# Patient Record
Sex: Female | Born: 1950 | Race: White | Hispanic: No | Marital: Married | Smoking: Never smoker
Health system: Southern US, Community
[De-identification: ages and names within clinical notes are randomized; demographics above are authoritative.]

## PROBLEM LIST (undated history)

## (undated) DIAGNOSIS — M51369 Other intervertebral disc degeneration, lumbar region without mention of lumbar back pain or lower extremity pain: Secondary | ICD-10-CM

## (undated) DIAGNOSIS — M169 Osteoarthritis of hip, unspecified: Secondary | ICD-10-CM

## (undated) DIAGNOSIS — M5030 Other cervical disc degeneration, unspecified cervical region: Secondary | ICD-10-CM

## (undated) DIAGNOSIS — F32A Depression, unspecified: Secondary | ICD-10-CM

## (undated) DIAGNOSIS — F329 Major depressive disorder, single episode, unspecified: Secondary | ICD-10-CM

## (undated) DIAGNOSIS — M797 Fibromyalgia: Secondary | ICD-10-CM

## (undated) DIAGNOSIS — M179 Osteoarthritis of knee, unspecified: Secondary | ICD-10-CM

## (undated) DIAGNOSIS — M069 Rheumatoid arthritis, unspecified: Secondary | ICD-10-CM

## (undated) DIAGNOSIS — M1710 Unilateral primary osteoarthritis, unspecified knee: Secondary | ICD-10-CM

## (undated) DIAGNOSIS — N189 Chronic kidney disease, unspecified: Secondary | ICD-10-CM

## (undated) DIAGNOSIS — M722 Plantar fascial fibromatosis: Secondary | ICD-10-CM

## (undated) DIAGNOSIS — M5136 Other intervertebral disc degeneration, lumbar region: Secondary | ICD-10-CM

## (undated) HISTORY — PX: REPLACEMENT TOTAL KNEE: SUR1224

## (undated) HISTORY — PX: CHOLECYSTECTOMY: SHX55

## (undated) HISTORY — DX: Osteoarthritis of knee, unspecified: M17.9

## (undated) HISTORY — DX: Major depressive disorder, single episode, unspecified: F32.9

## (undated) HISTORY — DX: Other intervertebral disc degeneration, lumbar region without mention of lumbar back pain or lower extremity pain: M51.369

## (undated) HISTORY — PX: APPENDECTOMY: SHX54

## (undated) HISTORY — DX: Unilateral primary osteoarthritis, unspecified knee: M17.10

## (undated) HISTORY — DX: Fibromyalgia: M79.7

## (undated) HISTORY — DX: Rheumatoid arthritis, unspecified: M06.9

## (undated) HISTORY — PX: KNEE ARTHROPLASTY: SHX992

## (undated) HISTORY — DX: Chronic kidney disease, unspecified: N18.9

## (undated) HISTORY — PX: ANKLE FRACTURE SURGERY: SHX122

## (undated) HISTORY — DX: Plantar fascial fibromatosis: M72.2

## (undated) HISTORY — DX: Osteoarthritis of hip, unspecified: M16.9

## (undated) HISTORY — PX: JOINT REPLACEMENT: SHX530

## (undated) HISTORY — PX: NECK SURGERY: SHX720

## (undated) HISTORY — PX: ELBOW SURGERY: SHX618

## (undated) HISTORY — DX: Depression, unspecified: F32.A

## (undated) HISTORY — PX: SPINE SURGERY: SHX786

## (undated) HISTORY — DX: Other intervertebral disc degeneration, lumbar region: M51.36

## (undated) HISTORY — PX: TOTAL HIP ARTHROPLASTY: SHX124

## (undated) HISTORY — PX: WRIST SURGERY: SHX841

## (undated) HISTORY — DX: Other cervical disc degeneration, unspecified cervical region: M50.30

---

## 2001-05-20 ENCOUNTER — Inpatient Hospital Stay (HOSPITAL_COMMUNITY): Admission: RE | Admit: 2001-05-20 | Discharge: 2001-05-21 | Payer: Self-pay | Admitting: Neurosurgery

## 2001-05-29 ENCOUNTER — Encounter: Admission: RE | Admit: 2001-05-29 | Discharge: 2001-05-29 | Payer: Self-pay | Admitting: Neurosurgery

## 2002-01-13 ENCOUNTER — Encounter: Payer: Self-pay | Admitting: Rheumatology

## 2002-01-13 ENCOUNTER — Encounter: Admission: RE | Admit: 2002-01-13 | Discharge: 2002-01-13 | Payer: Self-pay | Admitting: Rheumatology

## 2004-01-15 ENCOUNTER — Encounter: Admission: RE | Admit: 2004-01-15 | Discharge: 2004-01-15 | Payer: Self-pay | Admitting: Orthopaedic Surgery

## 2004-10-11 ENCOUNTER — Inpatient Hospital Stay (HOSPITAL_COMMUNITY): Admission: RE | Admit: 2004-10-11 | Discharge: 2004-10-14 | Payer: Self-pay | Admitting: Orthopaedic Surgery

## 2004-11-06 ENCOUNTER — Encounter: Admission: RE | Admit: 2004-11-06 | Discharge: 2004-11-06 | Payer: Self-pay | Admitting: Rheumatology

## 2006-08-29 ENCOUNTER — Ambulatory Visit (HOSPITAL_COMMUNITY): Admission: RE | Admit: 2006-08-29 | Discharge: 2006-08-29 | Payer: Self-pay | Admitting: Gastroenterology

## 2007-02-08 ENCOUNTER — Encounter: Admission: RE | Admit: 2007-02-08 | Discharge: 2007-02-08 | Payer: Self-pay | Admitting: Orthopaedic Surgery

## 2007-02-22 ENCOUNTER — Encounter: Admission: RE | Admit: 2007-02-22 | Discharge: 2007-02-22 | Payer: Self-pay | Admitting: Orthopaedic Surgery

## 2007-03-27 ENCOUNTER — Encounter: Admission: RE | Admit: 2007-03-27 | Discharge: 2007-03-27 | Payer: Self-pay | Admitting: Internal Medicine

## 2007-07-11 ENCOUNTER — Encounter: Admission: RE | Admit: 2007-07-11 | Discharge: 2007-07-11 | Payer: Self-pay | Admitting: Internal Medicine

## 2007-08-30 ENCOUNTER — Other Ambulatory Visit: Payer: Self-pay | Admitting: Orthopaedic Surgery

## 2007-09-03 ENCOUNTER — Emergency Department (HOSPITAL_COMMUNITY): Admission: EM | Admit: 2007-09-03 | Discharge: 2007-09-03 | Payer: Self-pay | Admitting: Emergency Medicine

## 2007-10-24 ENCOUNTER — Inpatient Hospital Stay (HOSPITAL_COMMUNITY): Admission: RE | Admit: 2007-10-24 | Discharge: 2007-10-27 | Payer: Self-pay | Admitting: Orthopaedic Surgery

## 2007-10-26 ENCOUNTER — Encounter (INDEPENDENT_AMBULATORY_CARE_PROVIDER_SITE_OTHER): Payer: Self-pay | Admitting: Orthopaedic Surgery

## 2008-04-10 ENCOUNTER — Encounter: Admission: RE | Admit: 2008-04-10 | Discharge: 2008-04-10 | Payer: Self-pay | Admitting: Orthopedic Surgery

## 2008-04-13 ENCOUNTER — Encounter: Admission: RE | Admit: 2008-04-13 | Discharge: 2008-04-13 | Payer: Self-pay | Admitting: Orthopedic Surgery

## 2008-04-24 ENCOUNTER — Encounter: Admission: RE | Admit: 2008-04-24 | Discharge: 2008-04-24 | Payer: Self-pay | Admitting: Orthopedic Surgery

## 2008-05-19 ENCOUNTER — Inpatient Hospital Stay (HOSPITAL_COMMUNITY): Admission: RE | Admit: 2008-05-19 | Discharge: 2008-05-22 | Payer: Self-pay | Admitting: Orthopaedic Surgery

## 2008-05-22 ENCOUNTER — Ambulatory Visit: Admission: RE | Admit: 2008-05-22 | Discharge: 2008-05-29 | Payer: Self-pay | Admitting: Radiation Oncology

## 2009-01-21 IMAGING — CR DG PORTABLE PELVIS
1 series · 1 of 1 positions shown · non-contrast
Comparison: None available

CLINICAL DATA: Infection

PORTABLE PELVIS

[view not recorded]
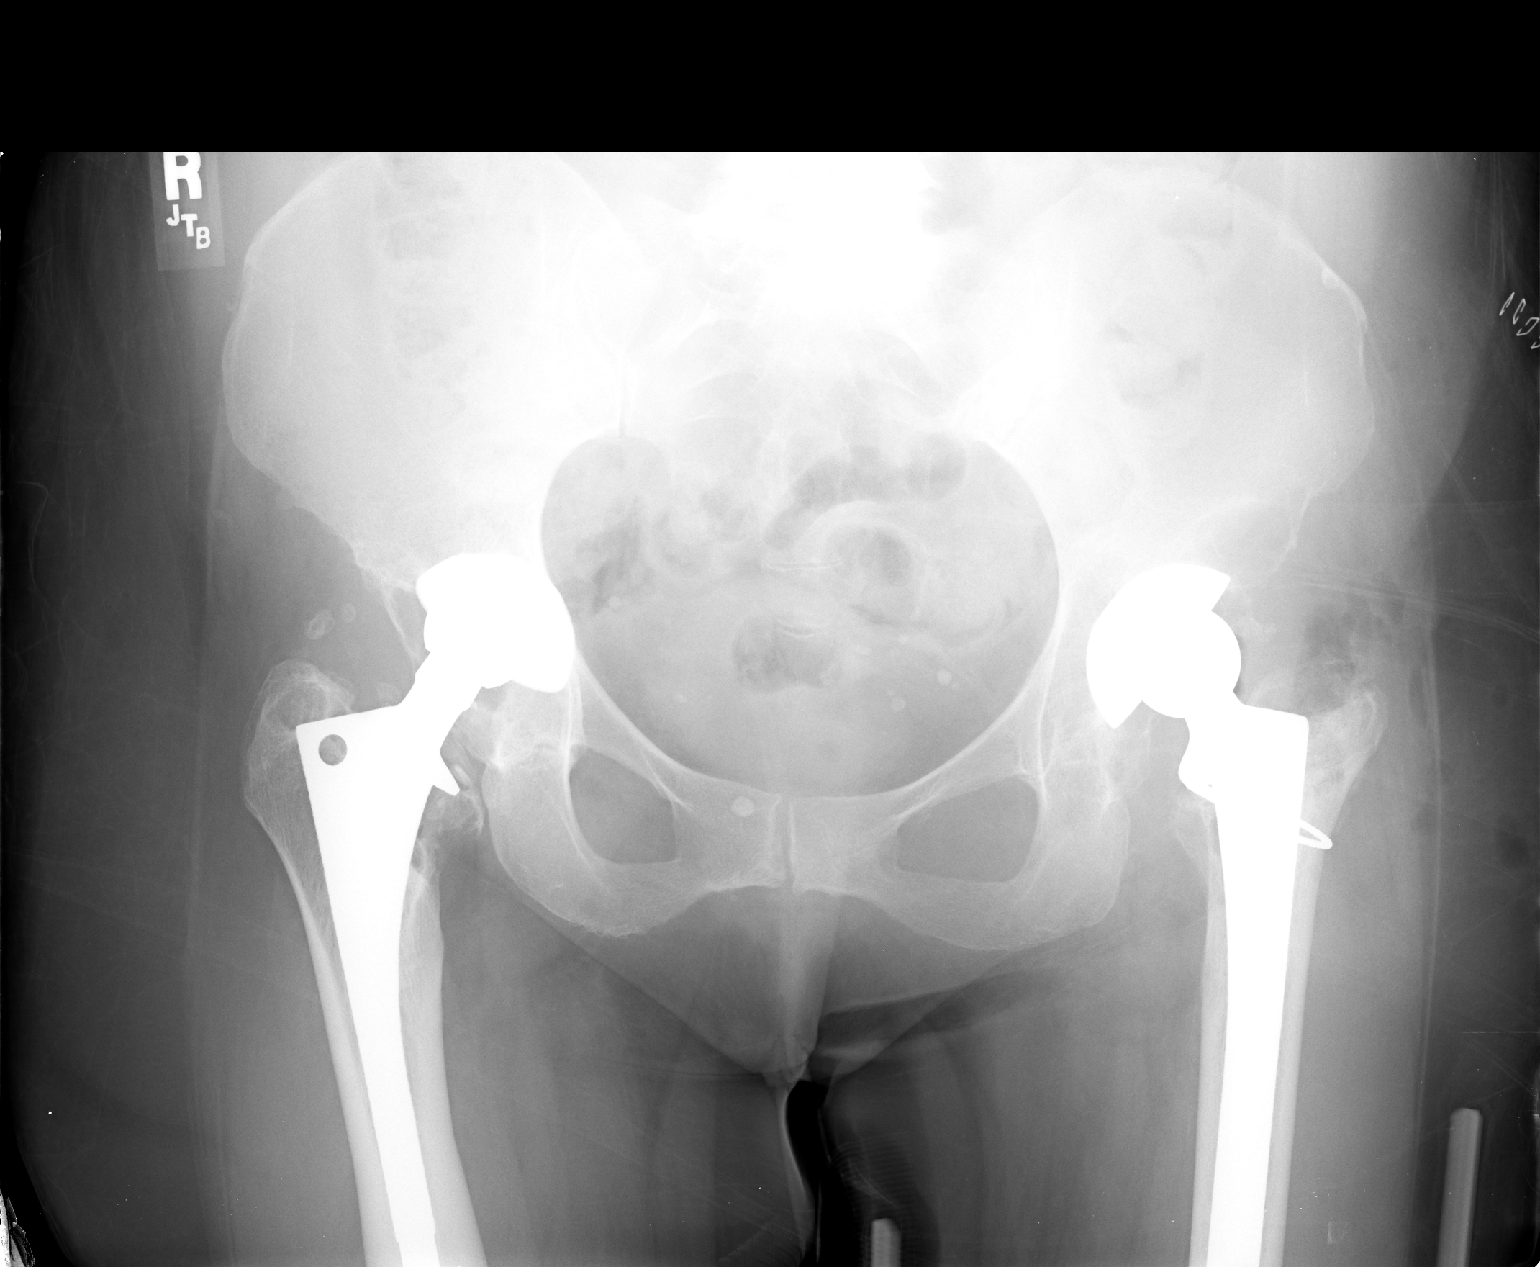

[1 of 1 positions shown; findings below may reference images not displayed]

FINDINGS: Components of bilateral hip arthroplasty.  The acetabular
components show superior migration or placement.  There is mild
protrusio acetabulae on the right.  Cerclage wire is noted about
the proximal left   femur.  Subcutaneous gas bubbles and left
lateral skin staples are noted.  Bilateral pelvic vascular
calcifications.  The distal aspect of the femoral components not
included.
IMPRESSION: 1.  Recent left hip arthroplasty without apparent complication,
distal tip of femoral component not included on this single film.

## 2010-08-02 ENCOUNTER — Encounter: Admission: RE | Admit: 2010-08-02 | Discharge: 2010-08-02 | Payer: Self-pay | Admitting: Orthopaedic Surgery

## 2010-10-16 ENCOUNTER — Encounter: Payer: Self-pay | Admitting: Orthopaedic Surgery

## 2010-10-17 ENCOUNTER — Encounter: Payer: Self-pay | Admitting: Orthopedic Surgery

## 2011-02-07 NOTE — Op Note (Signed)
Tammie [name], [name] NO.:  [id number]   MEDICAL RECORD NO.:  [id number]          PATIENT TYPE:  INP   LOCATION:  5007                         FACILITY:  [facility]   PHYSICIAN:  Claude Manges. Whitfield, M.D.DATE OF BIRTH:  [birth year]   DATE OF PROCEDURE:  10/24/2007  DATE OF DISCHARGE:                               OPERATIVE REPORT   PREOPERATIVE DIAGNOSIS:  End-stage rheumatoid arthritis left knee.   POSTOPERATIVE DIAGNOSIS:  End-stage rheumatoid arthritis left knee.   OPERATION/PROCEDURE:  Left total knee replacement surgery.   SURGEON:  Claude Manges. Cleophas Dunker, M.D.   ASSISTANT:  Arlys John D. Petrarca, P.A.-C.   ANESTHESIA:  General with supplemental femoral nerve block.   COMPLICATIONS:  None.   COMPONENTS:  DePuy LCA standard plus femoral component, #3 rotating  keeled tibial tray with a 10 mm bridging bearing and a metal back  rotating 3-peg patella.  All was secured with polymethyl methacrylate.   PROCEDURE:  The patient was comfortable on the operating table and under  general orotracheal anesthesia with a supplemental femoral nerve block,  the left lower extremity was placed in a thigh tourniquet.  Nursing  staff inserted a Foley catheter.  Urine was clear.   The left lower extremity was then prepped with Betadine scrub and then  DuraPrep from the tourniquet to the ankle.  Sterile draping was  performed.   With the extremity still elevated, was Esmarch exsanguinated with a  proximal tourniquet at 350 mmHg   The midline longitudinal incision was made, centered about the patella,  extending from the superior proximal tibial tubercle via sharp  dissection.  Incision carried down to subcutaneous tissue.  First layer  of capsule was incised in the midline.  A midline parapatellar incision  was made through the deep capsule with the Bovie.  There was a clear  yellow joint effusion.  The patella was then everted 180 degrees and  knee flexed to 90 degrees.  There  were large osteophytes along the  medial lateral femoral condyle, diffuse beefy red rheumatoid synovitis,  and significant loss of articular cartilage with areas of our complete  articular cartilage loss along the lateral and medial femoral condyle,  to a lesser extent the tibial plateau.   Preoperatively we had measured a #3 rotating tibial tray and a standard  plus femoral component.  These were confirmed intraoperatively.   First cut was made transversely in the proximal tibia with the external  tibial jig.  A posterior angle of declination of 7 degrees was utilized.   The initial femoral jig was then applied and then femoral cuts were made  anteriorly and posteriorly.  We checked flexion gap which is a perfectly  symmetrical medial and laterally with a 10 mm gap.   Subsequent cuts were then made on the femur anteriorly and then the  final taper cuts flexion/extension gaps were perfectly symmetrical.  The  LCL and MCL remained intact.  Lamina spreaders were then inserted along  the medial and lateral compartments to remove any remnants of medial and  lateral menisci as well as the ACL and PCL.  Osteophytes removed from  the  posterior aspect of the femoral condyles with a curved osteotome.  There was a Baker cyst identified medially which was debrided.   Retractor was then placed about the tibia, was advanced anteriorly.  We  measured a #3 tibial tray.  This was impacted.  Subsequent cuts were  then made in the center hole and for the keeled component.  With the  trial components in place, the 10 mm bridging bearing was inserted  followed by the trial femur.  Through a full range of motion, we had  full extension.  We had no opening with varus or valgus stress.   Patella was prepared by removing 10 mm of bone leaving 13 mm of patella  thickness.  The patella jig was applied to make the three pilot holes.  The trial tibia was applied and through a full range of motion, it was   perfectly stable.   The trial components removed.  The joint was copiously irrigated with  jet saline.  The final components were then impacted with polymethyl  methacrylate.  We initially applied the tibia.  Extraneous methacrylate  was removed from around its periphery.  A 10 mm rotating tibial  polyethylene component was then applied followed by the cemented femur  and extraneous methacrylate was removed with the Freer.   The knee was then placed in extension.  The patella applied with  methacrylate and patellar clamp.  After complete hardening of the  methacrylate,  the joint was inspected.  Any extraneous methacrylate was  removed with a small curved osteotome.  Prior to making the femoral  tibial cuts, a complete synovectomy was performed of the rheumatoid  synovium.   Marcaine with epinephrine was injected on either side of the capsule.  The tourniquet was deflated about an hour and 10 minutes.  Gross  bleeders were Bovie coagulated.  Hemovac was not necessary.  We again  irrigated the joint.  The deep capsule was closed with interrupted #1  Ethibond.  Superficial capsule closed with a running 0 and then  interrupted 0 and 2-0 Vicryl.  Skin closed with skin clips.  Sterile  bulky dressing was applied followed by the patient's support stocking.   The patient tolerated without complications.      Claude Manges. Cleophas Dunker, M.D.  Electronically Signed     PWW/MEDQ  D:  10/24/2007  T:  10/24/2007  Job:  [id number]

## 2011-02-07 NOTE — Op Note (Signed)
NAMETEMARA, LANUM NO.:  [id number]   MEDICAL RECORD NO.:  [id number]          PATIENT TYPE:  INP   LOCATION:  5008                         FACILITY:  [facility]   PHYSICIAN:  Claude Manges. 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   PROCEDURES:  1. Revision of femoral head and polyethylene component, left total hip      replacement.  2. Extensive synovectomy.  3. Excision of heterotopic ossification.  4. Bone grafting of proximal femur.   SURGEON:  Claude Manges. Cleophas Dunker, MD   ASSISTANT:  Oris Drone. Petrarca, PA-C   ANESTHESIA:  General orotracheal.   COMPLICATIONS:  None.   COMPONENTS:  A locking ring, a Marathon Duraloc acetabular liner 28-mm  inner diameter and 48-mm outer diameter with a 10-degree posterior lip.  A 28-mm outer diameter femoral head with a 1.5-mm neck length.   PROCEDURE:  With the patient comfortable in operating table and under  general orotracheal anesthesia, nursing staff inserted a Foley catheter.  Urine was clear.   The patient was then placed in a lateral decubitus position with the  left side up and secured to the operating room table with the Innomed  hip system.   The left hip was then prepped with Betadine scrub and then DuraPrep from  iliac crest to below the knee.  Sterile draping was performed.   The previous Southern incision was utilized and via sharp dissection  carried down through subcutaneous tissue.  Gross bleeders were Bovie  coagulated.  Adipose tissue was then incised to the level of the  iliotibial band.  Old  nonabsorbable sutures were then removed.  The  iliotibial band was then incised along length of the skin incision.  Self-retaining retractors were placed more deeply.  With the hip  internally rotated, abundant scar tissue was identified.  I did not see  obvious short external rotator tendons or muscles directly behind the  greater trochanter.  The scar and the capsule were incised in one layer.  There was a small clear yellow joint effusion.  Cultures were sent for  both anaerobic and aerobic bacteria.  The incision through the capsule  was carried more proximally and distally, so that I can visualize the  entire joint.  There was abundant poly wear synovitis.  I carefully  removed all the visible synovitis within the visual with incision at  that point and sent specimens for both the aerobic and anaerobic  bacterium.  Intraoperative Gram stain revealed a few monocytes, but no  organisms and very few polys.   The joint was then dislocated.  The femoral head was then removed from  the [facility] taper neck, it measured 1.5 mm in length and 28 mm outer  diameter.  With further capsulotomy, I could then place the [facility] taper  neck anteriorly and medial to the acetabulum, so that I could then  inspect it, that was obviously sent for quaere.  Using the polyethylene  extractor, the polyethylene component was then removed from the hip  joint.  The locking ring was removed as well.  The joint was then  irrigated.  Further synovectomy was then performed superiorly,  inferiorly, and medially.   There was considerable poly wear synovitis along the proximal inch to  inch and a half femoral stem.  The stem was perfectly stable.  There was  obvious nice bone ingrowth distally and there was no toggling.  I  removed all the obvious synovitis, but there was about an inch and half  of exposed stem without bone.  There was also erosion with into the  greater trochanter.  Synovitis was then removed from that  as well.   The patient also had considerable heterotopic ossification along the tip  of the greater trochanter.  This was removed circumferentially with the  Bovie.   The acetabulum was carefully inspected that appear to be perfectly  intact with a nice fit.  Any further synovitis was removed from around  its periphery, so I could visualize the entire cup.   A new locking ring was inserted without difficulty.  Because of the size  of the acetabulum, i.e., 48-mm outer diameter.  We have limited to a 28-  mm neck length.  A new Marathon Duraloc polyethylene liner was then  inserted with a 10-degree posterior lip placed posteriorly and then  impacted, it was nice and tight.   I reapplied a trial of 28-mm head with a one with a 5-mm neck length and  we could not reduce it, so therefore we just inserted.  We trialed the  1.5 neck length and this was a perfect fit and had perfect stability.  The patient was slightly shorter on that side and I want to make up the  difference, but because of the difficulty relocating of the five neck,  we elected to proceed with a 1.5-mm neck.   The trial component was then removed.  I removed all the synovitis from  around the femoral neck.  This was then irrigated and I impacted IC  graft chamber cortico-cancellous bone chips around the stem impacted and  it has had nice fit to the point where there was no exposed stem.  I  felt that the bone graft was perfectly stable.  Irrigation was performed  and I inspected the acetabulum with evidence of loose material.  The  final 28-mm head with a 1.5 neck length was then impacted after I  cleaned the Morris taper neck.  The entire construct was reduced and  through a full range of motion.  There was no toggling and it was not  tight and there was absolutely no dislocation.   The capsule was then closed with interrupted #1 Ethibond scarred down.  Short external rotators were closed with a similar material.   Iliotibial  band was closed with a running 0 Vicryl where subcu was closed in  several layers with 0 and 2-0 Vicryl.  Skin closed with skin clips.  Sterile bulky dressing was applied.   The patient tolerated the procedure without complications.      Claude Manges. Cleophas Dunker, M.D.  Electronically Signed     PWW/MEDQ  D:  05/19/2008  T:  05/20/2008  Job:  [id number]

## 2011-02-10 NOTE — Discharge Summary (Signed)
Tammie [name] [name], Tammie [name] NO.:  [name]   MEDICAL RECORD NO.:  [id number]          PATIENT TYPE:  INP   LOCATION:  0481                         FACILITY:  [facility]   PHYSICIAN:  Claude Manges. Whitfield, M.D.DATE OF BIRTH:  [birth year]   DATE OF ADMISSION:  10/11/2004  DATE OF DISCHARGE:                                 DISCHARGE SUMMARY   ADMISSION DIAGNOSES:  1.  End-stage osteoarthritis right knee.  2.  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  3.  Fibromyalgia.  4.  Osteoarthritis with positive rheumatoid factor.  5.  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  6.  History of vertigo.   DISCHARGE DIAGNOSES:  1.  End-stage osteoarthritis right knee status post right total knee      arthroplasty.  2.  Acute blood loss anemia secondary to surgery.  3.  Constipation.  4.  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  5.  Fibromyalgia.  6.  Osteoarthritis with positive rheumatoid factor.  7.  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  8.  History of vertigo.   SURGICAL PROCEDURE:  October 11, 2004 Tammie [name] [name] underwent a right total  knee arthroplasty with computer navigation by Dr. Claude Manges. Whitfield,  assisted by Dr. Rinaldo Ratel and Arlyn Leak, P.A.-C.  Tammie [name] had an LCS  Complete metal-back patella cemented size standard plus placed with an MBT  keeled cemented tibial tray size 3; an LCS Complete RP insert size standard  plus 15 mm thickness; and a LCS Complete primary femoral component cemented  size standard plus right.   COMPLICATIONS:  None.   CONSULTS:  1.  Physical therapy consult and pharmacy consult October 12, 2004.  2.  Occupational therapy consult October 13, 2004.   HISTORY OF PRESENT ILLNESS:  [name] presented  to Dr. Cleophas Dunker with the history of congenital hip deformities and right  knee pain.  At this point it is constant aching to sharp over the knee  without radiation.  Nothing really makes it worse or better. The knee swells  and hurts at night.  Tammie [name] has failed conservative treatment and because of  that, Tammie [name] is presenting for a right knee replacement.   HOSPITAL COURSE:  Tammie [name] [name] tolerated her surgical procedure well  without immediate postoperative complications.  Tammie [name] was transferred to [address].  Tammie [name] was not able to tolerate the CPM initially so that was adjusted.  Postoperative day #1, Tammie [name] was afebrile, vital signs stable.  Hemoglobin 9.5,  hematocrit 28.3.  Leg was neurovascularly intact.  Tammie [name] was continued on her  current pain medications but additional medications were added to help with  pain control.  That did help by the end of the day.  Tammie [name] was started on  therapy per protocol.   Postoperative day #2, Tmax 99.1, vital signs stable.  Hemoglobin 10.5,  hematocrit 31.7.  Right knee incision well approximated with staples.  Plaquenil was held due to the immunosuppressive effects.  Tammie [name] was switched  to p.o. pain medications and continued on therapy.  Tammie [name] did very well with  therapy.  On January 20 Tammie [name] is ready for discharge home.  Tammie [name] has had some  constipation that will be treated with a laxative.  Hemoglobin this morning  was 8.8 with a hematocrit of 26.7 but Tammie [name] is asymptomatic.  That will be  rechecked and Tammie [name] will most likely be discharged home on iron supplements.   DISCHARGE INSTRUCTIONS:  Diet:  Tammie [name] can resume her regular  prehospitalization diet.   Medications:  Tammie [name] may resume her prehospitalization medications except no  Plaquenil for a week after discharge.  Home medications at this time  include:  1.  OxyContin 20 mg one tablet p.o. q.12h.  2.  Celebrex 200 mg one tablet p.o. twice a day.  3.  Celexa 20 mg one tablet p.o. q.a.m.   Additional medications at this time will be:  1.  Coumadin 5 mg take as directed by pharmacy at 6 p.m. each day for 1      month.  Current dose is one tablet a day, #45 with no  refill.  2.  Tammie [name] will additionally add one OxyContin 10 mg tablet q.12h. with her      normal home dose for 10 days, #20 with no refill.  3.  Percocet 5/325 mg one to two tablets p.o. q.4h. p.r.n. for pain, #50      with no refill.  4.  Robaxin 500 mg one to two tablets p.o. q.6h. p.r.n. for spasms, #45 with      no refill.  5.  Tammie [name] is to take an iron supplement twice a day for 1 month.   Activity:  Tammie [name] is to be out of bed partial weightbearing 50% or less on the  right leg with the use of the walker.  Tammie [name] is arranged for home health PT  per [facility] care, as well as home health pharmacy and R.N.  Home  CPM 0-90 degrees 6-8 hours a day.  Please see the blue total knee discharge  sheet for further activity instructions.   Wound care:  Tammie [name] may shower after no drainage from the wound for 2 days.  Please see the blue total knee discharge sheet for further wound care  instructions.   Follow-up:  Tammie [name] needs to follow up with Dr. Cleophas Dunker in our office in  approximately 10 days and needs to call [phone] for that appointment.   LABORATORY DATA:  On January 18, hemoglobin was 9.5, hematocrit 28.3.  On  January 19, hemoglobin 10.5, hematocrit 31.7.  On January 20, hemoglobin  8.8, hematocrit 26.7, white count 3.9, and platelets 262.  A repeat  hemoglobin and hematocrit will be checked before discharge.   On January 19, PT 16.8, INR 1.5. On January 20, PT 20.2, INR 2.1.   Glucose ranged from 104 on January 10 to a high of 106 on January 18 to a  low of 101 on January 19.  Calcium ranged from 8.3 on January 10 to 7.9 on  January 18, and albumin was 3.4 on January 10.   On January 10, a urinalysis showed cloudy yellow urine with moderate  hemoglobin, 30 mg/dl of protein, moderate leukocyte esterase, many  epithelial cells, 7-10 white cells, 3-6 red cels, and few bacteria.  All  other laboratory studies were within normal limits.     KED/MEDQ  D:  10/14/2004  T:  10/14/2004   Job:  [id number]

## 2011-02-10 NOTE — Op Note (Signed)
[place]  Patient:    Tammie [name], Tammie [name]. Visit Number: [id number] MRN: [id number]          Service Type: SUR Location: [id number] Attending Physician:  Cristi Loron Dictated by:   Cristi Loron, M.D. Proc. Date: 05/20/01 Adm. Date:  05/20/2001 Disc. Date: 05/21/2001                             Operative Report  BRIEF HISTORY:  The patient is a 60 year old white female who has suffered from neck and arm pain. She failed medical management and was worked up as an outpatient with a cervical MRI that demonstrated significant herniated nucleus pulposus, spondylosis, and spinal stenosis at C4-C5 and C5-C6. She therefore weighted the risks, benefits and alternatives of surgery.  She decided to proceed with an anterior cervical diskectomy, fusion and plating.  PREOPERATIVE DIAGNOSIS:  C4-C5 and C5-C6 herniated nucleus pulposus, spondylosis, spinal stenosis, cervicalgia, cervical radiculopathy.  POSTOPERATIVE DIAGNOSIS:  C4-C5 and C5-C6 herniated nucleus pulposus, spondylosis, spinal stenosis, cervicalgia, cervical radiculopathy.  OPERATION:  C4-C5 and C5-C6 extensive anterior cervical diskectomy, interbody iliac crest Allograft arthrodesis, anterior cervical plating (Codman titanium plate and screws).  SURGEON:  Cristi Loron, M.D.  ASSISTANT:  Stefani Dama, M.D.  ANESTHESIA:  General endotracheal.  ESTIMATED BLOOD LOSS: Less than 150 cc.  SPECIMENS:  None.  DRAINS:  None.  COMPLICATIONS:  None.  DESCRIPTION OF PROCEDURE:  The patient was brought to the operating room by the anesthesia team.  General endotracheal anesthesia was induced.  The patient remained in the supine position.  A roll was placed under shoulders to place the neck in slight extension.  The anterior cervical region was then prepared with Betadine scrub and Betadine solution.  Sterile drapes were applied. I then injected the area to be incised  with Marcaine with epinephrine solution and used the scalpel to make a transverse incision in the patients left anterior neck.  I used the Metzenbaum scissors to divide the platysmal muscle.  I dissected medial to the sternocleidomastoid muscle, jugular vein and carotid artery.  I bluntly dissected down towards the anterior cervical spine. I identified the esophagus and carefully retracted it medially. I cleared the soft tissue from the anterior cervical spine using Kitner swabs. I inserted a bent spinal needle into one of the exposed interspaces and then obtained intraoperative radiograph to confirm our location.  I then used electrocautery to detach the medial border of the longus colli muscle bilaterally from the C4-C5 at C5-C6 intervertebral disc space.  I inserted the Caspar self retaining retractor for exposure and then began at C5-C6. I incised the C5-C6 intervertebral disc with 15 blade scalpel and then performed a partial diskectomy using the pituitary forceps.  I inserted the distraction screws at C5-C6 and distracted the interspace.  I brought to the operating microscope into the field.  Then under its magnification and illumination I completed the decompression. I drilled away the remainder of the C5-6 intervertebral disc. Decorticating the vertebral plates at E9-B2.  I thinned out the posterior longitudinal ligament with the drill and drilled some bone spurs.  I incised the posterior longitudinal ligament with the arachnoid knife and removed it with the Kerrison punch undercutting the vertebral end plates at W4-X3 decompressing the thecal sac.  I performed a foraminotomy about the bilateral C5-C6 nerve root.  Having completed the diskectomy at C5-C6, I now turned my attention to  C4-C5. I removed the distraction screw from C6 placed it in the C4 and distracted C4-C5 interspace and incised the disc with the 15 blade scalpel.  I performed a partial diskectomy with the pituitary  forceps. I then used the high speed drill to decorticate the vertebral body and end plates at Q4-O9 and drill away the remainder of the C4-C5 intervertebral disks.  I thinned out the posterior longitudinal ligament with the drill. I incised it with the arachnoid knife and then I removed it with the Kerrison punch.  I then cleaned the vertebral end plates of G2-X5. I then performed a foraminotomy at the bilateral C5 nerve root.  Having completed the anterior cervical diskectomy at C4-C5 and C5-C6, I now turned my attention to the arthrodesis.  I obtained iliac crest Allograft bone grafts and fashioned them to these approximate dimensions:  7 mm in height and 1 cm in depth.  I inserted one bone graft in the C4-C5 interspace, removed the C4 distraction screw and placed into C6 to distract the C5-C6.  Then placed another bone graft in C5-C6 interspace.  I removed the distraction screws. There was a good fit of the bone graft at both levels.  I now turned my attention to the anterior spinal instrumentation.  I obtained the appropriate length Codman anterior cervical plate. Laid it on the anterior aspect of the vertebral bodies from C4 to C6.  Drilled two holes at C4, C5 and C6.  Tapped these holes and secured the plate at the vertebral bodies placing two screws at C4, two at C5 and two at C6.  I then obtained an intraoperative radiograph. It demonstrated good position of the screws, plates and interbody grafts.  I then secured the screws to the plate using the Cam tightener.  I then copiously irrigated with Bacitracin solution and removed the solution. Acute extrinsic hemostasis with bipolar electrocautery and Gelfoam.  I removed the Caspar self retaining retractor and then inspected the esophagus for any damage. There was none apparent.  I then reapproximated the patients platysmal muscle with interrupted 3-0 Vicryl sutures.  Subcutaneous tissue with interrupted 3-0 Vicryl suture and the skin  with Steri-Strips with Benzoin. The wound was then coated with Bacitracin ointment.  A sterile dressing was applied.  The drapes were removed and the patient was  subsequently extubated by the anesthesia team and transported to the postanesthesia care unit in stable condition. All sponge, instrument and needle counts were correct at the end of this case. Dictated by:   Cristi Loron, M.D. Attending Physician:  Tressie Stalker D DD:  05/20/01 TD:  05/21/01 Job: [id number]

## 2011-02-10 NOTE — H&P (Signed)
Tammie Tammie [name], Tammie [name]          ACCOUNT NO.:  [id number]   MEDICAL RECORD NO.:  [id number]          PATIENT TYPE:  INP   LOCATION:  NA                           FACILITY[facility]  [facility]   PHYSICIAN:  Claude Manges. Whitfield, M.D.DATE OF BIRTH:  [birth year]   DATE OF ADMISSION:  10/11/2004  DATE OF DISCHARGE:                                HISTORY & PHYSICAL   CHIEF COMPLAINT:  Right knee pain for the last eight years.   HISTORY OF PRESENT ILLNESS:  [name] presented  to Dr. 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  She came in because her  right hip was bothering her but it was found to be problems with her right  knee.  At this point, the pain in the right knee came up gradually but has  been getting progressively worse.  She did have a right knee arthroscopy by  Dr. Madelon Lips in 1997.  The pain is now constant, aching to sharp sensation  with any movement.  It seems to be diffuse about the joint without  radiation. There are no aggravating or alleviating factors associated with  the pain.  The knee does swell at times and it hurts that she rolls over in  bed.  There is no popping, catching, grinding, locking, or giving way.  She  has had cortisone injections and supplementation with Hyalgan and that  provided just temporary relief.  She currently takes Celebrex and OxyContin  for pain and that provides some relief.   ALLERGIES:  No known drug allergies.   CURRENT MEDICATIONS:  1.  OxyContin 20 mg 1 tablet p.o. b.i.d.  2.  Plaquenil 200 mg 1 tablet p.o. b.i.d., last dose October 05, 2004.  3.  Celebrex 200 mg 1 tablet p.o. b.i.d.  4.  Celexa 20 mg 1 tablet p.o. q.a.m.   PAST MEDICAL HISTORY:  1.  Fibromyalgia.  2.  Osteoarthritis with positive rheumatoid factor, she denies any history      of diabetes mellitus, hypertension, thyroid disease, hiatal hernia,      peptic ulcer disease, heart  disease, asthma, reflux, or any other      chronic medical condition other than noted previously.   PAST SURGICAL HISTORY:  1.  She has had several surgeries on her hips due to dislocation and that      ranged from 1957 to 1958.  2.  Right total hip arthroplasty by Dr. Lacretia Nicks. Dava Najjar in 1996.  3.  Right knee arthroscopy by Dr. Madelon Lips in 1997.  4.  Left total hip arthroplasty by Dr. Madelon Lips in 1999.  5.  Cervical discectomy and fusion in 2003.  6.  Cesarean section in 1975.  7.  Cesarean section in 1977.  8.  Appendectomy in 1968.  She denies any complications from the above mentioned procedures.   SOCIAL HISTORY:  She drinks 1-2 glasses of wine every 3-4 months.  She does  not smoke or use any drugs.  She is married and has two children.  She lives  in a two story house  with five steps into the main entrance and 14 steps  into the second floor.  She lives there with her husband.  She does not  currently work.  She does not have a primary medical doctor but Dr.  Corliss Skains treats her for her fibromyalgia.   FAMILY HISTORY:  Mother is alive at age 78 with diabetes, pacemaker, and  osteoarthritis.  Her father is alive at age 51 with diabetes.  She has one  brother living, age 65, and he is healthy.  Her son is 56 and her daughter  is 74 and they are both alive and well.   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  She had that  when she was a child.  She has had some history of vertigo due to the  medication she has been on.  She does have some chronic neck pain from her  cervical surgery.  She does not have a living will nor a power of attorney.  All other systems are negative and noncontributory.   PHYSICAL EXAMINATION:  GENERAL:  Well developed, well nourished, white female in no acute distress.  She walks with a limp.  Mood and affect are appropriate.  She talks easily  with the  examiner, but very heavily accented [place].  Height 5 feet 6  inches, weight 170 pounds.  BMI 26 1/2.  VITAL SIGNS:  Temperature 97.6, pulse 72, respirations 14, blood pressure  120/84.  HEENT:  Normocephalic, atraumatic, without frontal or maxillary sinus  tenderness to palpation.  Conjunctivae pink, sclerae anicteric.  PERRLA.  EOMs intact.  No visible external ear deformities.  Hearing is slightly  decreased but this is difficult to tell.  Tympanic membrane on the right is  pearly gray with good light reflex.  On the left, it appears to be scarred,  light reflex is not visible.  Nose:  Nasal septum midline.  Nasal mucosa  pink and moist without exudates or polyps noted.  Buccal mucosa is pink and  moist.  Good dentition.  Pharynx without erythema or exudates, tongue and  uvula midline, tongue without fasciculations and uvula rises equally with  phonation.  NECK:  Well healed low left sided neck incision line.  No other physical  abnormalities noted.  Trachea midline.  No palpable lymphadenopathy or  thyromegaly.  Carotids +2 bilaterally without bruits.  Full range of motion  of the cervical spine with minimal complaints of pain.  CARDIOVASCULAR:  Heart rate and rhythm regular.  S1 and S2 present without  rubs, clicks, or murmurs noted.  RESPIRATORY:  Respirations even and unlabored.  Breath sounds clear to  auscultation bilaterally without rales or wheezes noted.  ABDOMEN:  Rounded abdominal contour.  Bowel sounds present x 4 quadrants.  Soft and nontender to palpation without hepatosplenomegaly or CVA  tenderness.  Femoral pulses +2 bilaterally.  Nontender to palpation along  the vertebral column.  BREASTS/GU/RECTAL/PELVIC:  These exams are deferred at this time.  MUSCULOSKELETAL:  No obvious deformities bilateral upper extremities with  full range of motion of these extremities without pain.  Radial pulses +2  bilaterally.  She has full range of motion of her ankles and  toes bilaterally.  DP and PT pulses are +2.  She has full extension of both hips  but the right one only flexes to 75 degrees, the left one to about 90.  She  has  decreased internal and external rotation of both hips and that causes  minimal pain.  No pain with palpation about the groin bilaterally.  The left  knee has full extension and flexion to 120 degrees with minimal crepitus.  She does have some mild pain with palpation over the medial joint line.  The  left knee appears to be in about 5 degrees of valgus position.  She is  stable to varus and valgus stress, negative anterior drawer.  The right knee  skin is intact without erythema or ecchymosis.  She is lacking about 20  degrees of full extension and she can flex the knee to 110 degrees with a  moderate amount of crepitus.  It seems to rest in almost 15 to 20 degrees of  valgus.  She is acutely tender to palpation over the lateral joint line.  There is a +1 effusion but she is stable to varus and valgus stress,  negative anterior drawer.  NEUROLOGICAL:  Alert and oriented x 3.  Cranial nerves 2-12 are grossly  intact.  Strength 5/5 in bilateral upper and lower extremities.  Rapid  alternating movements intact.  Deep tendon reflexes 2+ bilateral upper and  lower extremities.  Sensation intact to light touch.   RADIOLOGICAL FINDINGS:  Two views taken of her right knee in November 2005  shows 13-14 degrees of valgus with a lot of osteophytes at the  patellofemoral joint and lateral compartment.   IMPRESSION:  1.  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  2.  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  3.  Fibromyalgia.  4.  Osteoarthritis with positive rheumatoid factor.  5.  History of vertigo.  6.  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   PLAN:  Ms. Lafountain will be admitted to [facility] on October 11, 2004, where she will undergo a right total knee arthroplasty by Dr. Claude Manges.  Whitfield.  She will undergo all the routine preoperative laboratory tests  and studies prior to the procedure.  If she has any medical issues while she  is hospitalized, we will consult one of the hospitalist.     Sherrilyn Rist   KED/MEDQ  D:  10/05/2004  T:  10/05/2004  Job:  [id number]

## 2011-02-10 NOTE — Op Note (Signed)
NAMEGENEIEVE, DUELL NO.:  [id number]   MEDICAL RECORD NO.:  [id number]          PATIENT TYPE:  INP   LOCATION:  X002                         FACILITY:  [facility]   PHYSICIAN:  Claude Manges. Whitfield, M.D.DATE OF BIRTH:  24-Jun-1951   DATE OF PROCEDURE:  10/11/2004  DATE OF DISCHARGE:                                 OPERATIVE REPORT   PREOPERATIVE DIAGNOSIS:  End-stage osteoarthritis, right knee.   POSTOPERATIVE DIAGNOSIS:  End-stage osteoarthritis, right knee.   OPERATION:  Right total knee replacement.   SURGEON:  Claude Manges. Cleophas Dunker, M.D.   ASSISTANT:  Thereasa Distance A. Chaney Malling, M.D. and Jamelle Rushing, P.A.-C.   ANESTHESIA:  General orotracheal with supplemental nerve block.   COMPLICATIONS:  None.   COMPONENTS:  DePuy LCS complete standard plus femoral component, #3 rotating  keel tibial tray with a 15 mm bridging bearing and a metal back three-pegged  patella.  All of this was secured with poly-methyl methacrylate.  Computed  navigation was utilized.   DESCRIPTION OF PROCEDURE:  With the patient comfortable on the operating  room table, and under general orotracheal anesthesia, the right lower  extremity was placed in the thigh tourniquet.  The nursing staff inserted a  Foley catheter.  The right lower extremity was then prepped with Betadine  scrub and then DuraPrep from the tourniquet to the ankle.  Sterile drain was  performed.  The extremity was elevated and Esmarch exsanguinated with the  proximal tourniquet at 350 mmHg.  At approximately 20 minutes into the case,  the tourniquet deflated.  We inserted a new tourniquet under sterile  technique.  The total tourniquet time was approximately one hour and 35  minutes.  A midline longitudinal incision was then utilized and very sharp  dissection carried down to the subcutaneous tissue.  The first layer of  capsule was incised in the midline.  The medial parapatellar incision was  made in a straight line with the  Bovie.  The patella was then everted 180  degrees, and the knee flexed to 90 degrees.  There were large osteophytes on  the medial and lateral femoral condyle.  Large areas of articular cartilage  loss, mostly along the lateral femoral condyle where there was an increased  valgus position and osteophytes along the lateral femoral condyle.  The  DePuy navigation system was utilized.  The __________ were then inserted  into the tibia and femur, using two 4 mm pins.  The __________ were then  applied, and with excellent visualization of both femoral and tibial  components.  Then using the navigation system, morphing of the femur and the  tibia was performed, to obtain a computer-generated visualization of the  femur and the tibia.  Using the above guide lines, the proximal tibia was  initially osteotomized, and then using the tensioning guide, a lateral  release of the tibia was performed, so that flexion and extension and varus  and valgus were perfectly symmetrical.  The femoral cuts were then made,  again using the computer-generated femoral cuts, again using the computer-  generated guide, using a 12.5 mm flexion extension guide which was  symmetrical.  The lamina spreader was then inserted.  The medial and lateral  menisci were then removed, as well as anterior and posterior cruciate  ligaments.  The popliteus was released.  Retractors were then placed about  the tibia.  A #3 was confirmed.  The trial #3 tibial tray was then applied  and the appropriate cuts performed.  The keeled cut was then made with the  keel stem.  Using the trial components, we initially utilized a 12.5 mm  bridging bearing.  The standard plus femoral component was inserted.  We had  full and slight hyper-extension.  I opened up medially more than I felt was  appropriate.  Therefore we trialed a 15 mm bridging bearing.  We felt that  we still had full extension and much less opening laterally.  There was no   malrotation of the tibial component.  The patella was then prepared by  removing 10 mm of bone, leaving about 12 mm of thickness.  The trial patella  was then applied.  Clamps were applied to the capsule.  The knee was placed  in a full range of motion without subluxation.  Trial components were then  removed.  The __________ were removed, as well as the four pins, i.e. two in  the femur and two in the tibia.  The site was copiously irrigated with jet  saline.  Then each of the components were inserted with poly-methyl  methacrylate.  The trans-methacrylate was removed.  The trial tibial tray  was inserted and removed so that we could visualize the posterior joint, and  any extraneous methacrylate was removed.  The joint was then irrigated with  saline solution.  The final 15 mm bridging bearing was then inserted.  The  knee was placed through a full range of motion, with excellent position and  stability.  The tourniquet was deflated.  Gross bleeders were Bovie  coagulated.  A Hemovac was inserted.  The deep capsule was closed with  interrupted #1 Ethibond.  The superficial capsule was closed with running #0  Vicryl, and the subcutaneous with #2-0 Vicryl.  The skin was closed with  skin clips.  A sterile bulky dressing was applied, followed by an Ace  bandage and the patient's support stocking.   The patient tolerated the procedure without complications.  A femoral nerve  block was inserted postoperatively for pain control.      PWW/MEDQ  D:  10/11/2004  T:  10/11/2004  Job:  [id number]

## 2011-02-10 NOTE — Op Note (Signed)
NAMEMERVE, HOTARD          ACCOUNT NO.:  [id number]   MEDICAL RECORD NO.:  [id number]          PATIENT TYPE:  AMB   LOCATION:  ENDO                         FACILITY:  [facility]   PHYSICIAN:  Anselmo Rod, M.D.  DATE OF BIRTH:  Oct 15, 1950   DATE OF PROCEDURE:  08/29/2006  DATE OF DISCHARGE:  08/29/2006                               OPERATIVE REPORT   PROCEDURE PERFORMED:  Screening colonoscopy.   ENDOSCOPIST:  Anselmo Rod, M.D.   INSTRUMENT USED:  Olympus video colonoscope.   INDICATIONS FOR PROCEDURE:  A 60 year old female undergoing a  colonoscopy to rule out colonic polyps, masses, etc.  Patient has a  history of constipation but denies a family history of colon cancer.   PRE-PROCEDURE PREPARATION:  Informed consent was procured from the  patient.  The patient was fasted for eight hours prior to the procedure  and prepped with a gallon of TriLyte the night prior to the procedure.  Risks and benefits of the procedure, including a 10% missed rate of  cancer and polyps, were discussed with the patient as well.   PRE-PROCEDURE PHYSICAL EXAMINATION:  VITAL SIGNS:  Stable vital signs.  NECK:  Supple.  CHEST:  Clear to auscultation.  HEART:  S1 and S2.  Regular.  ABDOMEN:  Soft with normal bowel sounds.   DESCRIPTION OF PROCEDURE:  The patient was placed in the left lateral  decubitus position and sedated with 150 mcg of fentanyl and 15 mg of  Versed given intravenously in slow, incremental doses.  Once the patient  was adequately sedated, maintained on low flow oxygen, and continuous  cardiac monitoring, the Olympus video colonoscope was advanced from the  rectum to the cecum.  Scattered diverticulosis was noted with more  prominent changes in the left colon.  Patient has significant abdominal  discomfort with insufflation of air into the colon, indicating a  component of visceral hypersensitivity, consistent with IBS.  No masses  or polyps were identified.   Retroflexion in the rectum revealed no  abnormalities.  The patient tolerated the procedure well without  immediate complications.   IMPRESSION:  1. Scattered diverticulosis with more prominent changes in the left      colon.  2. Question visceral hypersensitivity, consistent with irritable bowel      syndrome.  3. No masses or polyps seen.   RECOMMENDATIONS:  1. Continue a high-fiber diet with liberal fluid intake.  2. Avoid laxatives.  3. Repeat colonoscopy in the next 10 years unless the patient develops      any abnormal symptoms in the interim, in which case she should      contact the office immediately for further recommendations.  4. Outpatient followup as the needs arise in the future.      Anselmo Rod, M.D.  Electronically Signed     JNM/MEDQ  D:  08/31/2006  T:  08/31/2006  Job:  [id number]   cc:   Olene Craven, M.D.

## 2011-02-10 NOTE — Discharge Summary (Signed)
NAMESHAMARIA, [name] NO.:  [id number]   MEDICAL RECORD NO.:  [id number]          PATIENT TYPE:  INP   LOCATION:  5008                         FACILITY:  [facility]   PHYSICIAN:  Claude Manges. Whitfield, M.D.DATE OF BIRTH:  1950/11/03   DATE OF ADMISSION:  05/19/2008  DATE OF DISCHARGE:  05/22/2008                               DISCHARGE SUMMARY   ADMISSION DIAGNOSIS:  Painful left total hip arthroplasty.   DISCHARGE DIAGNOSES:  1. Painful left total hip arthroplasty.  2. Posthemorrhagic anemia.  3. Myalgia and myositis.  4. Rheumatoid arthritis.  5. Hypertension.  6. Fibromyalgia.   PROCEDURE:  Revision of left total hip arthroplasty.   HISTORY:  This is a 60 year old female with rheumatoid arthritis and  osteoarthritis.  She underwent a total hip arthroplasty in 2009.  Recently, she went to [place] and had done a fair amount of walking.  She  developed significant pain and limping in early July.  Pain is mainly in  her left hip.  She underwent a left hip aspiration, which revealed  26,000 white cells.  Pain is worse and she is scheduled now for an I&D  left hip with poly exchange and possible femoral revision.   HOSPITAL COURSE:  A 60 year old female was admitted on May 19, 2008.  After appropriate laboratory studies were obtained, she was taken to the  operating room where she underwent an I&D of the left total hip.  She  tolerated the procedure well.  This included removal of femoral head as  well as polyethylene component with replacement.  Extensive synovectomy  and excision of heterotopic ossification with bone grafting of the  proximal femur.  She was placed on Ancef 1 g IV q.6 h. x3 doses  postoperatively.  She received 1 g in the operating room.  She was  started on Coumadin per protocol as well as Lovenox 30 mg subcu q.12 h.,  started on May 20, 2008, at 8 a.m. until the Coumadin became  therapeutic.  Started on OxyContin 20 mg p.o. q.12 h. after  discontinuation of the Dilaudid PCA.   CONSULTS:  PT/OT care management were made.   She is allowed to be weightbearing as tolerated.  Hip precautions were  placed.  She is allowed out of bed to chair the following day.   Her hemoglobin dropped and she was given 2 units of packed cells on  May 20, 2008.  She was weaned off her PCA as well as her O2 on May 20, 2008.  Her Robaxin was discontinued.  She was placed on Skelaxin 800  mg t.i.d. on May 21, 2008.  OxyContin was increased to 30 mg p.o.  q.12 h. with Percocet 5/325 one to two p.o. q.4 h. p.r.n. pain.  Ancef  on May 21, 2008, was given for 3 more doses because of total hip  revision.  Her dressing was changed on May 21, 2008.  Her wound was  benign.  Lovenox was discontinued on May 22, 2008, and she was  discharged on May 22, 2008, after her radiation treatment to the hip  to help prevent recurrence of heterotopic ossification.  She was  transfused 2 units of packed cells during her hospital course.   No laboratory studies or radiographic studies were on the chart at the  time of this dictation.  EKG revealed normal sinus rhythm with low-  voltage QRS.  This was from December 2008.  Portable left hip did reveal  a left hip arthroplasty without apparent complications.   DISCHARGE INSTRUCTIONS:  She can be placed on a low-sodium, heart-  healthy diet.  Increase activity slowly.  Use a walker, weightbearing as  tolerated.  May shower.  No lifting or driving for 6 weeks.  Change  dressing daily.  Keep incision clean and dry.  Placed 4 x 4s tape over  the wound.   PRESCRIPTION:  1. Percocet 5/325 one to two tablets every 4 hours as needed for pain.  2. Coumadin 5 mg as directed by home health.  3. OxyContin 30 mg one tablet every 12 hours for pain.  4. She will restart Plaquenil on June 05, 2008.  5. Colace 100 mg b.i.d. as stool softener.  6. Genevieve Norlander for home health.   FOLLOWUP:  She will follow up  with Dr. Cleophas Dunker on June 01, 2008.  Discharged in improved condition.      Oris Drone Petrarca, P.A.-C.      Claude Manges. Cleophas Dunker, M.D.  Electronically Signed    BDP/MEDQ  D:  06/23/2008  T:  06/23/2008  Job:  [id number]

## 2011-02-10 NOTE — H&P (Signed)
[place]  Patient:    Tammie [name], Tammie [name]. Visit Number: [id number] MRN: [id number]          Service Type: SUR Location: 3000 [id number] Attending Physician:  Tressie Stalker D Adm. Date:  05/20/2001                           History and Physical  CHIEF COMPLAINT:  Neck pain.  HISTORY OF PRESENT ILLNESS:  The patient is a 60 year old [place] immigrant who has been trouble with her neck for approximately 18 months.  She had been treated with medications and she failed to improved after medications and injections.  She went on to have a cervical MRI which demonstrated significant spondylosis and stenosis at C5-6 and C6-7.  The patient weighed the risks, benefits, and alternatives of surgery and decided to proceed with a C5-6 and C6-7 anterior cervical diskectomy, fusion, and plating.  PAST MEDICAL HISTORY:  Positive for hip dysplasia, knee osteoarthritis, hearing loss.  PAST SURGICAL HISTORY:  Multiple hip surgeries bilaterally, right knee surgery in 1998, cesarean section in 1975 and 1979, appendectomy in 1969.  MEDICATIONS PRIOR TO ADMISSION: 1. Plaquenil 200 mg p.o. b.i.d. 2. Celexa 20 mg p.o. q.d. 3. Celebrex 200 mg b.i.d. 4. OxyContin 20 mg b.i.d.  ALLERGIES:  She has no known drug allergies.  FAMILY MEDICAL HISTORY:  The patients parents are both age 67.  Both have diabetes mellitus.  SOCIAL HISTORY:  The patient is a [place] immigrant.  She lives at [address]. She is not employed.  She denies tobacco, ethanol, or drug use.  She has two children.  She is married.  REVIEW OF SYSTEMS:  Negative except as above.  PHYSICAL EXAMINATION:  GENERAL:  A pleasant, well-nourished, well-developed, 60 year old white female complaining of neck pain and left greater than right arm pain.  VITAL SIGNS:  Height 5 feet 6 inches, weight 145 pounds.  HEENT:  Normal.  NECK:  Supple.  There are no masses, deformities, tracheal deviations,  jugular venous distention, carotid bruits.  She has a limited cervical range of motion.  Spurlings test is positive bilaterally.  Lhermittes sign was not present.  Thorax was symmetrical.  LUNGS:  Clear to auscultation.  HEART:  Regular rate and rhythm.  ABDOMEN:  Soft, nontender.  EXTREMITIES:  No obvious deformities.  She has a good bilateral upper and lower extremity range of motion.  BACK:  Demonstrates no poor test deformities.  Faberes testing demonstrates decreased range of motion, right greater than left.  Straight leg raise testing is equivocal bilaterally.  NEUROLOGIC:  The patient is alert and oriented x 3.  Cranial nerves II-XII are grossly intact bilaterally except she has some decreased hearing bilaterally. Motor strength is 5/5 in her bilateral deltoids, biceps, triceps, hand grip, wrist extensor, interosseus, psoas, quadriceps, gastrocnemius, and 4+/5 bilateral extensor hallucis longus.  Cerebellar exam is intact to rapid alternating movements of the upper extremities bilaterally.  Sensory exam demonstrates normal light touch and pinprick sensation in all tested dermatomes bilaterally.  Deep tendon reflexes are 2+/4 in her bilateral biceps, triceps, brachial radialis, left quadriceps, and gastrocnemius, 1/4 in the right quadriceps and gastrocnemius.  She has equivocal plantar responses bilaterally without ankle clonus.  DIAGNOSTIC STUDIES:  The patient had a cervical MRI performed without contrast January 16, 2001, at [facility], demonstrates that she has a straight cervical spine.  C3-4 has a mild central bulging without significant spinal stenosis or neural  foraminal stenosis.  C4-5 has a left-sided herniated disk and spondylosis resulting in left greater than right foraminal stenosis and moderate spinal stenosis.  C5-6 has a left spondylosis and bulging disk resulting in left neuroforaminal stenosis and moderate to severe spondylosis.   C6-7 has associated spondylosis with neuroforamina patent.  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  I have discussed the situation with the patient and reviewed MRI scan with her and her daughter, pointing out the abnormalities.  She has significant narrowing at C5-6 and C4-5.  I have discussed the various treatment options including doing nothing, continued medical management, and surgery.  I have described a C4-5 and C5-6 anterior cervical diskectomy, interbody iliac crest allograft arthrodesis, anterior cervical plating.  I have described the surgery to her, showed her the surgical models, and discussed the risks of surgery extensively.  The patient has weighed the risks, benefits, and alternatives of surgery and wants to proceed with the operation on May 20, 2001. Attending Physician:  Tressie Stalker D DD:  05/20/01 TD:  05/20/01 Job: [id number]

## 2011-02-10 NOTE — Discharge Summary (Signed)
Tammie [name], Tammie [name] NO.:  [id number]   MEDICAL RECORD NO.:  [id number]           PATIENT TYPE:   LOCATION:  5007                         FACILITY:  [facility]   PHYSICIAN:  Tammie [name], M.D.DATE OF BIRTH:  1951/01/08   DATE OF ADMISSION:  10/24/2007  DATE OF DISCHARGE:  10/27/2007                               DISCHARGE SUMMARY   ADMISSION DIAGNOSIS:  Osteoarthritis left knee.   DISCHARGE DIAGNOSES:  1. Osteoarthritis left knee.  2. Fibromyalgia.  3. Labile hypertension.  4. Migraines.  5. Acute blood loss anemia   PROCEDURE:  Left total knee arthroplasty.   HISTORY:  Tammie [name] is a 60 year old white female with fibromyalgia  treated by Dr. Corliss Skains as well as for osteoarthritis.  She presently  is on chronic OxyContin for her pain.  Was scheduled for left total knee  arthroplasty in November 2008, but had severe headache and was  cancelled.  She had pain in her left knee which is constant, severe and  interfering with her activities of daily living and her sleep.  Radiographic bone-on-bone.  No compartment or tricompartment  osteoarthritis.  For left total knee arthroplasty.   HOSPITAL COURSE:  A 60 year old female admitted October 24, 2007 after  appropriate laboratory studies were obtained as well as 2 grams of Ancef  IV on call in the operating room 3000 units of heparin IV on call  subcutaneously.  She was taken to the operating room where she underwent  a left total knee arthroplasty by Dr. Norlene [name] assisted by Tammie [name].  She tolerated the procedure well.  She was placed with a  Dilaudid PCA pump for pain management.  Heparin 3000 units subcu. q.12  h. until her Coumadin became therapeutic.  CPM from 0-70 degrees for 6-8  hours per day.  Can increase this by 10 degrees per day.  PT, OT and  care management consults were made.  Partial weightbearing 50%.  She was  allowed out of bed to a chair the following day.  Doppler  was ordered to  rule out DVT of her left calf.  On October 25, 2007, she also weaned off  her O2.  IV fluid was increased to 100 cc/hour after 500 cc IV bolus.  On October 26, 2007, her Foley was discontinued.  She was hypokalemic  and was given K-Dur 40 mEq p.o. t.i.d.  Milk of Magnesia was also  ordered.  Remainder of her hospital course was uneventful.  She was  discharged on October 27, 2007 to return back to our office in followup.   DIAGNOSTICS:  1. Dopplers revealed no evidence of DVT, superficial thrombosis or      Baker's cyst.  2. EKG August 30, 2007 revealed normal sinus rhythm with low-voltage      QRS.   LABORATORY STUDIES:  Hemoglobin 12.8, hematocrit 38.0%, white count  4100, platelets 265,000.  Discharge hemoglobin 9.0,  hematocrit 26.6%,  white count 5600, platelets 192,000, preop pro-time 11.9, INR 0.9, PTT  26.  Discharge pro-time 25.3, INR 2.2.  Preop sodium 141, potassium 4.0,  chloride 103, CO2 31, glucose 98, BUN 16,  creatinine 0.88, GFR was  greater than 60, total protein 7.1, albumin 3.9, AST 66, ALT 81, ALP  110, total bilirubin 0.7.  Discharge sodium 137, potassium 4.1, chloride  106, CO2 26, glucose 83, BUN 9, creatinine 0.88.  Her potassium 3.0 on  October 26, 2007.  Urinalysis benign for voided urine.  Blood type was  A+, antibody screen negative.  Urine culture showed 60,000 colonies per  mL and multiple bacterial morphotypes.   DISCHARGE INSTRUCTIONS:  1. She was given no restriction in her diet.  2. Increase activity slowly.  3. No lifting or driving for 6 weeks.  4. Follow the white total knee discharge sheet.  5. Her CPM 0-70 degrees increasing by 10 degrees per day.  6. Ambulate weightbearing as instructed.  7. Follow back up with Dr. Cleophas [name] on November 06, 2007.   DISCHARGE MEDICATIONS:  1. Coumadin 5 mg once a day as taken as directed by pharmacy.  2. Percocet 1-2 tabs a day 4-6 hours as needed for pain.   DISPOSITION:  She was  discharged in improved condition.      Tammie [name], P.A.-C.      Tammie [name], M.D.  Electronically Signed    BDP/MEDQ  D:  11/22/2007  T:  11/23/2007  Job:  [id number]

## 2011-05-12 ENCOUNTER — Other Ambulatory Visit (HOSPITAL_COMMUNITY): Payer: Self-pay | Admitting: Orthopaedic Surgery

## 2011-05-12 DIAGNOSIS — M25559 Pain in unspecified hip: Secondary | ICD-10-CM

## 2011-05-19 ENCOUNTER — Encounter (HOSPITAL_COMMUNITY)
Admission: RE | Admit: 2011-05-19 | Discharge: 2011-05-19 | Disposition: A | Discharge status: Home / Self Care | Payer: BC Managed Care – PPO | Source: Ambulatory Visit | Attending: Orthopaedic Surgery | Admitting: Orthopaedic Surgery

## 2011-05-19 DIAGNOSIS — Z96659 Presence of unspecified artificial knee joint: Secondary | ICD-10-CM | POA: Insufficient documentation

## 2011-05-19 DIAGNOSIS — M25559 Pain in unspecified hip: Secondary | ICD-10-CM | POA: Insufficient documentation

## 2011-05-19 DIAGNOSIS — Z96649 Presence of unspecified artificial hip joint: Secondary | ICD-10-CM | POA: Insufficient documentation

## 2011-05-19 MED ORDER — TECHNETIUM TC 99M MEDRONATE IV KIT
23.1000 | PACK | Freq: Once | INTRAVENOUS | Status: AC | PRN
  Administered 2011-05-19: 23.1 via INTRAVENOUS

## 2011-06-15 LAB — COMPREHENSIVE METABOLIC PANEL
ALT: 81 — ABNORMAL HIGH
Albumin: 3.9
Alkaline Phosphatase: 110
Chloride: 103
GFR calc Af Amer: >60
GFR calc non Af Amer: >60
Potassium: 4
Sodium: 141
Total Bilirubin: 0.7
Total Protein: 7.1

## 2011-06-15 LAB — CBC
HCT: 28.4 — ABNORMAL LOW
HCT: 31.3 — ABNORMAL LOW
Hemoglobin: 12.8
Hemoglobin: 9.7 — ABNORMAL LOW
MCHC: 34.1
MCHC: 34.2
MCV: 92.3
Platelets: 203
RBC: 3.08 — ABNORMAL LOW
RBC: 3.4 — ABNORMAL LOW
WBC: 6.4

## 2011-06-15 LAB — DIFFERENTIAL
Basophils Relative: 0
Eosinophils Absolute: 0
Monocytes Absolute: 0.3
Neutro Abs: 2.6

## 2011-06-15 LAB — PROTIME-INR
INR: 0.9
INR: 1

## 2011-06-15 LAB — URINALYSIS, ROUTINE W REFLEX MICROSCOPIC
Bilirubin Urine: NEGATIVE
Glucose, UA: NEGATIVE
Hgb urine dipstick: NEGATIVE
Ketones, ur: NEGATIVE
Nitrite: NEGATIVE
Specific Gravity, Urine: 1.015
Urobilinogen, UA: 0.2
pH: 6.5

## 2011-06-15 LAB — CROSSMATCH: Antibody Screen: NEGATIVE

## 2011-06-15 LAB — BASIC METABOLIC PANEL
BUN: 11
CO2: 27
Calcium: 8.1 — ABNORMAL LOW
Calcium: 8.5
Chloride: 103
GFR calc non Af Amer: >60
Glucose, Bld: 129 — ABNORMAL HIGH

## 2011-06-15 LAB — URINE CULTURE

## 2011-06-16 LAB — CBC
HCT: 26.6 — ABNORMAL LOW
Hemoglobin: 9 — ABNORMAL LOW
MCV: 92.6
Platelets: 192
RDW: 13.5

## 2011-06-16 LAB — BASIC METABOLIC PANEL
BUN: 9
Chloride: 106
Glucose, Bld: 83
Potassium: 4.1
Sodium: 137

## 2011-07-03 LAB — I-STAT 8, (EC8 V) (CONVERTED LAB)
Acid-Base Excess: 2
Chloride: 106
Glucose, Bld: 111 — ABNORMAL HIGH
Hemoglobin: 13.6
Potassium: 3.6
Sodium: 140
TCO2: 27

## 2011-07-03 LAB — URINALYSIS, ROUTINE W REFLEX MICROSCOPIC
Glucose, UA: NEGATIVE
Hgb urine dipstick: NEGATIVE
Protein, ur: NEGATIVE
Urobilinogen, UA: 0.2

## 2011-07-03 LAB — PROTIME-INR
INR: 0.9
Prothrombin Time: 12

## 2011-07-03 LAB — COMPREHENSIVE METABOLIC PANEL
ALT: 23
AST: 31
Alkaline Phosphatase: 91
CO2: 32
Calcium: 9.2
Chloride: 103
GFR calc Af Amer: >60
GFR calc non Af Amer: 53 — ABNORMAL LOW
Glucose, Bld: 95
Potassium: 4.1
Sodium: 141
Total Bilirubin: 0.9

## 2011-07-03 LAB — DIFFERENTIAL
Basophils Absolute: 0
Basophils Relative: 1
Eosinophils Absolute: 0.1 — ABNORMAL LOW
Eosinophils Relative: 2
Lymphs Abs: 1.5
Neutrophils Relative %: 53

## 2011-07-03 LAB — CBC
Hemoglobin: 12.4
MCHC: 33.9
RBC: 3.95
WBC: 4.2

## 2011-07-03 LAB — CROSSMATCH: Antibody Screen: NEGATIVE

## 2011-07-03 LAB — POCT I-STAT CREATININE: Operator id: 198171

## 2015-01-15 ENCOUNTER — Other Ambulatory Visit: Payer: Self-pay | Admitting: Nephrology

## 2015-01-15 DIAGNOSIS — N183 Chronic kidney disease, stage 3 unspecified: Secondary | ICD-10-CM

## 2015-01-19 ENCOUNTER — Ambulatory Visit
Admission: RE | Admit: 2015-01-19 | Discharge: 2015-01-19 | Disposition: A | Discharge status: Home / Self Care | Payer: 59 | Source: Ambulatory Visit | Attending: Nephrology | Admitting: Nephrology

## 2015-01-19 DIAGNOSIS — N183 Chronic kidney disease, stage 3 unspecified: Secondary | ICD-10-CM

## 2016-07-17 DIAGNOSIS — M797 Fibromyalgia: Secondary | ICD-10-CM | POA: Insufficient documentation

## 2016-07-17 DIAGNOSIS — R5383 Other fatigue: Secondary | ICD-10-CM | POA: Insufficient documentation

## 2016-07-17 DIAGNOSIS — G4700 Insomnia, unspecified: Secondary | ICD-10-CM | POA: Insufficient documentation

## 2016-07-17 DIAGNOSIS — Z79899 Other long term (current) drug therapy: Secondary | ICD-10-CM | POA: Insufficient documentation

## 2016-07-17 DIAGNOSIS — M0579 Rheumatoid arthritis with rheumatoid factor of multiple sites without organ or systems involvement: Secondary | ICD-10-CM | POA: Insufficient documentation

## 2016-07-18 ENCOUNTER — Ambulatory Visit (INDEPENDENT_AMBULATORY_CARE_PROVIDER_SITE_OTHER): Payer: 59 | Admitting: Rheumatology

## 2016-07-18 ENCOUNTER — Encounter: Payer: Self-pay | Admitting: Rheumatology

## 2016-07-18 VITALS — BP 157/84 | HR 70 | Resp 14 | Ht 66.0 in | Wt 150.0 lb

## 2016-07-18 DIAGNOSIS — Z79899 Other long term (current) drug therapy: Secondary | ICD-10-CM

## 2016-07-18 DIAGNOSIS — G4700 Insomnia, unspecified: Secondary | ICD-10-CM

## 2016-07-18 DIAGNOSIS — M461 Sacroiliitis, not elsewhere classified: Secondary | ICD-10-CM | POA: Diagnosis not present

## 2016-07-18 DIAGNOSIS — M25552 Pain in left hip: Secondary | ICD-10-CM | POA: Diagnosis not present

## 2016-07-18 DIAGNOSIS — M25551 Pain in right hip: Secondary | ICD-10-CM

## 2016-07-18 DIAGNOSIS — M0579 Rheumatoid arthritis with rheumatoid factor of multiple sites without organ or systems involvement: Secondary | ICD-10-CM | POA: Diagnosis not present

## 2016-07-18 DIAGNOSIS — M797 Fibromyalgia: Secondary | ICD-10-CM

## 2016-07-18 DIAGNOSIS — M62838 Other muscle spasm: Secondary | ICD-10-CM | POA: Diagnosis not present

## 2016-07-18 DIAGNOSIS — R5383 Other fatigue: Secondary | ICD-10-CM | POA: Diagnosis not present

## 2016-07-18 MED ORDER — TRIAMCINOLONE ACETONIDE 40 MG/ML IJ SUSP
40.0000 mg | INTRAMUSCULAR | Status: AC | PRN
  Administered 2016-07-18: 40 mg via INTRA_ARTICULAR

## 2016-07-18 MED ORDER — LIDOCAINE HCL 1 % IJ SOLN
1.0000 mL | INTRAMUSCULAR | Status: AC | PRN
  Administered 2016-07-18: 1 mL

## 2016-07-18 MED ORDER — METHOCARBAMOL 500 MG PO TABS: 500.0000 mg | ORAL_TABLET | Freq: Two times a day (BID) | ORAL | 5 refills | Status: DC

## 2016-07-18 MED ORDER — TRIAMCINOLONE ACETONIDE 40 MG/ML IJ SUSP
10.0000 mg | INTRAMUSCULAR | Status: AC | PRN
  Administered 2016-07-18: 10 mg via INTRAMUSCULAR

## 2016-07-18 MED ORDER — LIDOCAINE HCL 1 % IJ SOLN
0.3000 mL | INTRAMUSCULAR | Status: AC | PRN
  Administered 2016-07-18: .3 mL

## 2016-07-18 NOTE — Progress Notes (Signed)
*IMAGE* Office Visit Note  Patient: Tammie PottKrystyna E Diguglielmo             Date of Birth: Aug 12, 1951           MRN: [id number]             PCP: COUSINS, MELISSA A, FNP Referring: No ref. provider found Visit Date: 07/18/2016    Subjective:  Here for RA and FMS followup.    History of Present Illness: Tammie [name] is a 65 y.o. female   C/o flare of FMS Pain to both SI JOINTS AND TRAPEZIUS MUSCLES. Pt states pain is "9" from FMS flare.  DOING WELL W/  RA. HAS NOT BEEN TAKING PLQ B/C WE DID NOT REFILL DUE TO MISSING LABS/PLQ EYE EXAM.  WE SENT IT IN AFTER PT GOT THOSE ITEMS UPDATED.  PT ALSO C/O PNEUMONIA X 2 MONTHS. HAD 6 INJECTIONS (COMBINATION OF ANTIBIOTICS AND STEROID).  FINALLY STARTED DOING BETTER ONLY 2 WEEKS AGO.  NEEDS PARKING PLACARD RENEWED.   Activities of Daily Living:  Patient reports morning stiffness for 15 minute.   Patient Reports nocturnal pain.  Difficulty dressing/grooming: Reports Difficulty climbing stairs: Reports Difficulty getting out of chair: Reports Difficulty using hands for taps, buttons, cutlery, and/or writing: Reports   Review of Systems  Constitutional: Positive for fatigue.  HENT: Negative for mouth sores and mouth dryness.   Eyes: Negative for dryness.  Respiratory: Negative for shortness of breath.   Gastrointestinal: Negative for constipation and diarrhea.  Musculoskeletal: Positive for myalgias and myalgias.  Skin: Negative for sensitivity to sunlight.  Psychiatric/Behavioral: Positive for sleep disturbance. Negative for decreased concentration.    PMFS History:  Patient Active Problem List   Diagnosis Date Noted  . Trapezius muscle spasm 07/18/2016  . Bilateral sacroiliitis (HCC) 07/18/2016  . Rheumatoid arthritis involving multiple sites with positive rheumatoid factor (HCC) 07/17/2016  . High risk medications (not anticoagulants) long-term use 07/17/2016  . Fibromyalgia 07/17/2016  . Fatigue 07/17/2016  . Insomnia  07/17/2016    Past Medical History:  Diagnosis Date  . CRI (chronic renal insufficiency)   . DDD (degenerative disc disease), cervical   . DDD (degenerative disc disease), lumbar   . Depression   . Fibromyalgia   . OA (osteoarthritis) of knee   . Osteoarthritis of hip   . Plantar fasciitis, bilateral   . Rheumatoid arthritis (HCC)     No family history on file. Past Surgical History:  Procedure Laterality Date  . REPLACEMENT TOTAL KNEE Bilateral   . TOTAL HIP ARTHROPLASTY Bilateral    Social History   Social History Narrative  . No narrative on file     Objective: Vital Signs: BP (!) 157/84 (BP Location: Right Arm, Patient Position: Sitting, Cuff Size: Large)   Pulse 70   Resp 14   Ht 5\' 6"  (1.676 m)   Wt 150 lb (68 kg)   BMI 24.21 kg/m    Physical Exam  Constitutional: She is oriented to person, place, and time. She appears well-developed and well-nourished.  HENT:  Head: Normocephalic and atraumatic.  Eyes: EOM are normal. Pupils are equal, round, and reactive to light.  Cardiovascular: Normal rate, regular rhythm and normal heart sounds.  Exam reveals no gallop and no friction rub.   No murmur heard. Pulmonary/Chest: Effort normal and breath sounds normal. She has no wheezes. She has no rales.  Abdominal: Soft. Bowel sounds are normal. She exhibits no distension. There is no tenderness. There is no guarding. No hernia.  Musculoskeletal: Normal range of motion. She exhibits no edema, tenderness or deformity.  Lymphadenopathy:    She has no cervical adenopathy.  Neurological: She is alert and oriented to person, place, and time. Coordination normal.  Skin: Skin is warm and dry. Capillary refill takes less than 2 seconds. No rash (VERY DRY SKIN /FLAKEY) noted.  Psychiatric: She has a normal mood and affect. Her behavior is normal.     Musculoskeletal Exam:  DECREASED RANGE OF MOTION DUE TO PAIN FROM FMS FLARE. TO BILATERAL SI JOINTS AND TRAPEZIUS. ALL OTHER  JOINTS W/ GOOD ROM.  CDAI Exam: CDAI Homunculus Exam:   Joint Counts:  CDAI Tender Joint count: 0 CDAI Swollen Joint count: 0  Global Assessments:  Patient Global Assessment: 0 Provider Global Assessment: 0    Investigation: No additional findings.  PT'S CBC W/ DIFF FROM AUG 2017 IS WNLS CMP W/ GFR IS NORMAL EXCEPT ELEVATED CREATININE AT 1.2 AND GFR IS 48 ( WE WILL MONITOR THIS ABNL VALUE ON NEXT LABS).   Imaging: No results found.  Speciality Comments: No specialty comments available.    Procedures:  Trigger Point Inj Date/Time: 07/18/2016 1:19 PM Performed by: Tawni Pummel Authorized by: Tawni Pummel   Consent Given by:  Patient Indications:  Muscle spasm and pain Total # of Trigger Points:  2 Location: neck   Needle Size:  27 G Approach:  Dorsal Medications #1:  0.3 mL lidocaine 1 %; 10 mg triamcinolone acetonide 40 MG/ML Medications #2:  0.3 mL lidocaine 1 %; 10 mg triamcinolone acetonide 40 MG/ML Comments: PT TOLERATED PROCEDURE WELL THERE WERE NO COMPLICATIONS  50% IMPROVEEMENT AFTER 5 MINS OF INJECTIONS. Large Joint Inj Date/Time: 07/18/2016 1:20 PM Performed by: Tawni Pummel Authorized by: Tawni Pummel   Consent Given by:  Patient Indications:  Pain Location:  Hip Site:  L hip joint Needle Size:  27 G Needle Length:  1.5 inches Approach:  Superior Ultrasound Guidance: No   Fluoroscopic Guidance: No  no Medications:  1 mL lidocaine 1 %; 40 mg triamcinolone acetonide 40 MG/ML Large Joint Inj Date/Time: 07/18/2016 1:23 PM Performed by: Tawni Pummel Authorized by: Tawni Pummel   Consent Given by:  Patient Site marked: the procedure site was marked   Timeout: prior to procedure the correct patient, procedure, and site was verified   Indications:  Pain Location:  Hip Site:  R hip joint Approach:  Superior Ultrasound Guidance: No   Fluoroscopic Guidance: No  no Medications:  1 mL lidocaine 1 %; 40 mg triamcinolone  acetonide 40 MG/ML Aspiration Attempted: Yes   Aspirate amount (mL):  0 Comments: PATIENT TOLERATED PROCEDURE WELL. NO COMPLICATION  50% IMPROVED AFTER INJECTION   Allergies: Review of patient's allergies indicates no known allergies.   Assessment / Plan: Visit Diagnoses: Rheumatoid arthritis involving multiple sites with positive rheumatoid factor (HCC)  High risk medications (not anticoagulants) long-term use  Fibromyalgia  Fatigue, unspecified type  Insomnia, unspecified type  Trapezius muscle spasm  Bilateral sacroiliitis (HCC)   No problem-specific Assessment & Plan notes found for this encounter.   Follow-Up Instructions: Return in about 5 months (around 12/16/2016) for RA, HRRX, Fibromyalga.  Orders: Orders Placed This Encounter  Procedures  . Trigger Point Injection  . Large Joint Injection/Arthrocentesis  . Large Joint Injection/Arthrocentesis   Meds ordered this encounter  Medications  . hydroxychloroquine (PLAQUENIL) 200 MG tablet    Sig: Take 200 mg by mouth 2 (two) times daily.  Marland Kitchen oxyCODONE-acetaminophen (PERCOCET) 10-325 MG tablet  Sig: Take 1 tablet by mouth every 4 (four) hours as needed for pain.  . nortriptyline (PAMELOR) 25 MG capsule    Sig: Take 25 mg by mouth as needed for sleep.  Marland Kitchen DISCONTD: metaxalone (SKELAXIN) 800 MG tablet    Sig: Take 800 mg by mouth 3 (three) times daily.  Marland Kitchen DISCONTD: methocarbamol (ROBAXIN) 500 MG tablet    Sig: Take 500 mg by mouth 2 (two) times daily.  Marland Kitchen buPROPion (WELLBUTRIN XL) 150 MG 24 hr tablet    Sig: Take 150 mg by mouth daily.  Marland Kitchen FLUoxetine (PROZAC) 20 MG tablet    Sig: Take 20 mg by mouth daily.  Marland Kitchen zolpidem (AMBIEN) 10 MG tablet    Sig: Take 1 tablet by mouth at bedtime as needed. USE ONLY AT NIGHT TO HELP W/ MUSCLE SPASM AND SLEPP. (DO NOT USE W/ ROBAXIN IN DAYTIME)    Refill:  5  . methocarbamol (ROBAXIN) 500 MG tablet    Sig: Take 1 tablet (500 mg total) by mouth 2 (two) times daily. AVOID USING AT  NIGHT. ONLY FOR DAYTIME USE AS MUSCLE RELAXER    Dispense:  60 tablet    Refill:  5

## 2016-07-28 ENCOUNTER — Other Ambulatory Visit: Payer: Self-pay | Admitting: Rheumatology

## 2016-07-28 NOTE — Telephone Encounter (Addendum)
Last visit 07/18/16 Next visit/11/2016 Labs WNL 05/05/16  Eye exam WNL 06/2016 Ok to refill per Dr Corliss Skainseveshwar

## 2016-09-03 ENCOUNTER — Other Ambulatory Visit: Payer: Self-pay | Admitting: Rheumatology

## 2016-09-04 NOTE — Telephone Encounter (Signed)
Last Visit: 07/18/16 Next Visit: 12/18/16 Labs: 05/04/16 Creat. 1.20 and GFR 48 PLQ Eye exam: 06/28/16 WNL  Okay to refill PLQ?

## 2016-11-18 ENCOUNTER — Other Ambulatory Visit: Payer: Self-pay | Admitting: Rheumatology

## 2016-11-20 NOTE — Telephone Encounter (Addendum)
Last Visit: 07/18/16 Next Visit: 12/18/16 Labs: WNL 05/05/16 PLQ Eye Exam: 06/28/16 WNL   Left message for patient regarding updated labs  Okay to refill 30 day supply PLQ?

## 2016-11-21 NOTE — Telephone Encounter (Signed)
ok 

## 2016-11-22 ENCOUNTER — Other Ambulatory Visit: Payer: Self-pay | Admitting: Rheumatology

## 2016-11-22 NOTE — Telephone Encounter (Signed)
Last Visit: 07/18/16 Next Visit: 12/18/16  Okay to refill Ambien?

## 2016-11-22 NOTE — Telephone Encounter (Signed)
ok 

## 2016-11-23 ENCOUNTER — Telehealth: Payer: Self-pay | Admitting: Rheumatology

## 2016-11-23 NOTE — Telephone Encounter (Signed)
Patient left a message requesting a new prescription for Ambien.  [id number]#[phone]#[phone].  Thank you.

## 2016-11-23 NOTE — Telephone Encounter (Signed)
Left message for patient to call the office

## 2016-11-24 NOTE — Telephone Encounter (Signed)
Patient advised prescription sent to pharmacy. 

## 2016-12-06 ENCOUNTER — Other Ambulatory Visit: Payer: Self-pay | Admitting: *Deleted

## 2016-12-06 MED ORDER — HYDROXYCHLOROQUINE SULFATE 200 MG PO TABS: ORAL_TABLET | ORAL | 0 refills | Status: DC

## 2016-12-06 NOTE — Telephone Encounter (Signed)
Last Visit: 07/18/16 Next Visit: 12/18/16 Labs: WNL 05/05/16 PLQ Eye Exam: 06/28/16 WNL  Okay to refill 30 supply PLQ?

## 2016-12-18 ENCOUNTER — Ambulatory Visit: Payer: 59 | Admitting: Rheumatology

## 2017-01-08 ENCOUNTER — Other Ambulatory Visit: Payer: Self-pay | Admitting: Rheumatology

## 2017-01-09 NOTE — Telephone Encounter (Signed)
Pls make appt for patient mid to late may 2018 w/ dr. D for eval and treatment  ===== Pneumonia for a long time late december  Bilateral anlke fx-surgery right leg Dr Samuel Bouche [place]
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
## 2017-01-12 NOTE — Telephone Encounter (Signed)
Attempted to call patient to schedule an appointment and one number on file was disconnected and the other number did not have a voicemail box set up. Will try again.

## 2017-02-06 ENCOUNTER — Other Ambulatory Visit: Payer: Self-pay | Admitting: Rheumatology

## 2017-02-08 DIAGNOSIS — M722 Plantar fascial fibromatosis: Secondary | ICD-10-CM | POA: Insufficient documentation

## 2017-02-08 DIAGNOSIS — Z8659 Personal history of other mental and behavioral disorders: Secondary | ICD-10-CM | POA: Insufficient documentation

## 2017-02-08 DIAGNOSIS — M160 Bilateral primary osteoarthritis of hip: Secondary | ICD-10-CM | POA: Insufficient documentation

## 2017-02-08 DIAGNOSIS — M5136 Other intervertebral disc degeneration, lumbar region: Secondary | ICD-10-CM | POA: Insufficient documentation

## 2017-02-08 DIAGNOSIS — M5030 Other cervical disc degeneration, unspecified cervical region: Secondary | ICD-10-CM | POA: Insufficient documentation

## 2017-02-08 DIAGNOSIS — M170 Bilateral primary osteoarthritis of knee: Secondary | ICD-10-CM | POA: Insufficient documentation

## 2017-02-08 DIAGNOSIS — Z87448 Personal history of other diseases of urinary system: Secondary | ICD-10-CM | POA: Insufficient documentation

## 2017-02-08 DIAGNOSIS — Z96659 Presence of unspecified artificial knee joint: Secondary | ICD-10-CM | POA: Insufficient documentation

## 2017-02-08 DIAGNOSIS — Z96643 Presence of artificial hip joint, bilateral: Secondary | ICD-10-CM | POA: Insufficient documentation

## 2017-02-08 NOTE — Progress Notes (Addendum)
Office Visit Note  Patient: Tammie [name]             Date of Birth: 1951-09-17           MRN: [id number]             PCP: Henderson Baltimore, FNP Referring: Henderson Baltimore, FNP Visit Date: 02/16/2017 Occupation: '@[email or web address]' @    Subjective:  Pain hips.  History of Present Illness: Tammie [name] is a 66 y.o. female    Patient has a history of rheumatoid arthritis and fibromyalgia.  I saw her at the last visit on 07/18/2016. At that visit patient was having pneumonia for the last 2 months (August and September 2017). By the beginning of October she started feeling better after getting a combination of antibiotics and steroids (had about 6 injections).  Today, patient continues to have irritation in her throat and continues to cough. While taking history, patient continues to clear her throat and cough on occasion. She had an appointment with a pulmonologist but could not go because she broke her ankles.  Patient states that she was taking her dog for a walk and was not paying attention and accidentally slipped off of the curb and broke both of her ankles on 12/06/2016.  Review of Epic shows patient saw Dr. Letitia Neri and the following diagnosis was found on 12/06/2016 Epic note:Closed fracture of both ankles, initial encounter Displaced fracture of medial malleolus of right tibia, initial encounter for closed fracture.  patient started feeling better about 2 weeks ago and is able to ambulate without too much difficulty.   She did not reschedule her pulmonology appointment because she did not know how long it would take after the surgery to ambulate and make to her doctor's visit.  Currently, patient is having significant amount of pain. Her pain doctor, Dr. Rickey Primus, stop working and patient can no longer get pain medication. She has not discussed this with her PCP and I have asked the patient to talk to her PCP, Dr. Lenna Sciara cousins to help her with her pain  management for now until she can establish with a new pain doctor. Her PCP can coordinate that.  Currently, she describes her pain as 8 on a scale of 0-10 on a bad day. Her fibromyalgia, bilateral ankle injury are probably of the source of most of her pain and discomfort.  She also complains of pain to her mid and lower back. She also has pain in bilateral SI joint and bilateral greater trochanter bursa. I did give her injections and SI joint as well as trigger point injections to bilateral trapezius muscles at the last visit and it helped her for good amount of time.   Activities of Daily Living:  Patient reports morning stiffness for 30 minutes.   Patient Reports nocturnal pain.  Difficulty dressing/grooming: Reports Difficulty climbing stairs: Reports Difficulty getting out of chair: Reports Difficulty using hands for taps, buttons, cutlery, and/or writing: Denies   Review of Systems  Constitutional: Positive for fatigue.  HENT: Negative for mouth sores and mouth dryness.   Eyes: Negative for dryness.  Respiratory: Negative for shortness of breath.   Gastrointestinal: Negative for constipation and diarrhea.  Musculoskeletal: Positive for myalgias and myalgias.  Skin: Negative for sensitivity to sunlight.  Psychiatric/Behavioral: Positive for sleep disturbance. Negative for decreased concentration.    PMFS History:  Patient Active Problem List   Diagnosis Date Noted  . DDD (degenerative disc disease), lumbar 02/08/2017  . DDD (  degenerative disc disease), cervical 02/08/2017  . Primary osteoarthritis of both hips 02/08/2017  . Bilateral primary osteoarthritis of knee 02/08/2017  . History of renal insufficiency 02/08/2017  . Plantar fasciitis 02/08/2017  . History of bilateral total hip arthroplasty 02/08/2017  . History of total knee replacement 02/08/2017  . History of depression 02/08/2017  . Trapezius muscle spasm 07/18/2016  . Bilateral sacroiliitis ([place]) 07/18/2016  .  Rheumatoid arthritis involving multiple sites with positive rheumatoid factor ([place]) 07/17/2016  . High risk medications (not anticoagulants) long-term use 07/17/2016  . Fibromyalgia 07/17/2016  . Fatigue 07/17/2016  . Insomnia 07/17/2016    Past Medical History:  Diagnosis Date  . CRI (chronic renal insufficiency)   . DDD (degenerative disc disease), cervical   . DDD (degenerative disc disease), lumbar   . Depression   . Fibromyalgia   . OA (osteoarthritis) of knee   . Osteoarthritis of hip   . Plantar fasciitis, bilateral   . Rheumatoid arthritis ([place])     History reviewed. No pertinent family history. Past Surgical History:  Procedure Laterality Date  . REPLACEMENT TOTAL KNEE Bilateral   . TOTAL HIP ARTHROPLASTY Bilateral    Social History   Social History Narrative  . No narrative on file     Objective: Vital Signs: BP 130/80   Pulse 80   Resp 12   Ht 5' (1.524 m)   Wt 152 lb (68.9 kg)   BMI 29.69 kg/m    Physical Exam  Constitutional: She is oriented to person, place, and time. She appears well-developed and well-nourished.  HENT:  Head: Normocephalic and atraumatic.  Eyes: EOM are normal. Pupils are equal, round, and reactive to light.  Cardiovascular: Normal rate, regular rhythm and normal heart sounds.  Exam reveals no gallop and no friction rub.   No murmur heard. Pulmonary/Chest: Effort normal and breath sounds normal. She has no wheezes. She has no rales.  Abdominal: Soft. Bowel sounds are normal. She exhibits no distension. There is no tenderness. There is no guarding. No hernia.  Musculoskeletal: Normal range of motion. She exhibits no edema, tenderness or deformity.  Lymphadenopathy:    She has no cervical adenopathy.  Neurological: She is alert and oriented to person, place, and time. Coordination normal.  Skin: Skin is warm and dry. Capillary refill takes less than 2 seconds. No rash noted.  Psychiatric: She has a normal mood and affect. Her  behavior is normal.  Nursing note and vitals reviewed.    Musculoskeletal Exam:  Full range of motion of all joints Grip strength is equal and strong bilaterally For myalgia tender points are all absent  CDAI Exam: CDAI Homunculus Exam:   Joint Counts:  CDAI Tender Joint count: 0 CDAI Swollen Joint count: 0  Global Assessments:  Patient Global Assessment: 10 Provider Global Assessment: 10  CDAI Calculated Score: 20  No synovitis on exam  Investigation: Findings:  06/28/2016 normal PLQ eye exam  No visits with results within 6 Month(s) from this visit.  Latest known visit with results is:  Admission on 10/24/2007, Discharged on 10/27/2007  Component Date Value Ref Range Status  . ABO/RH(D) 10/23/2007 A POS   Final  . Antibody Screen 10/23/2007 NEG   Final  . Sample Expiration 10/23/2007 10/30/2007   Final  . Unit Number 10/23/2007 [id number]   Final  . Blood Component Type 10/23/2007 RED CELLS,LR   Final  . Status of Unit 10/23/2007 REL FROM [facility]   Final  . Transfusion Status 10/23/2007 OK TO  TRANSFUSE   Final  . Crossmatch Result 10/23/2007 Compatible   Final  . Unit Number 10/23/2007 [id number]   Final  . Blood Component Type 10/23/2007 RED CELLS,LR   Final  . Status of Unit 10/23/2007 REL FROM [facility]   Final  . Transfusion Status 10/23/2007 OK TO TRANSFUSE   Final  . Crossmatch Result 10/23/2007 Compatible   Final  . WBC 10/23/2007 4.1   Final  . RBC 10/23/2007 4.12   Final  . Hemoglobin 10/23/2007 12.8   Final  . HCT 10/23/2007 38.0   Final  . MCV 10/23/2007 92.1   Final  . MCHC 10/23/2007 33.7   Final  . RDW 10/23/2007 13.3   Final  . Platelets 10/23/2007 265   Final  . Sodium 10/23/2007 141   Final  . Potassium 10/23/2007 4.0   Final  . Chloride 10/23/2007 103   Final  . CO2 10/23/2007 31   Final  . Glucose, Bld 10/23/2007 98   Final  . BUN 10/23/2007 16   Final  . Creatinine, Ser 10/23/2007 0.88   Final  . Calcium 10/23/2007 9.4   Final  . Total Protein  10/23/2007 7.1   Final  . Albumin 10/23/2007 3.9   Final  . AST 10/23/2007 66*  Final  . ALT 10/23/2007 81*  Final  . Alkaline Phosphatase 10/23/2007 110   Final  . Total Bilirubin 10/23/2007 0.7   Final  . GFR calc non Af Amer 10/23/2007 >60   Final  . GFR calc Af Wyvonnia Lora 10/23/2007    Final                   Value:>60                                The eGFR has been calculated                         using the MDRD equation.                         This calculation has not been                         validated in all clinical  . Neutrophils Relative % 10/23/2007 63   Final  . Neutro Abs 10/23/2007 2.6   Final  . Lymphocytes Relative 10/23/2007 28   Final  . Lymphs Abs 10/23/2007 1.2   Final  . Monocytes Relative 10/23/2007 8   Final  . Monocytes Absolute 10/23/2007 0.3   Final  . Eosinophils Relative 10/23/2007 1   Final  . Eosinophils Absolute 10/23/2007 0.0   Final  . Basophils Relative 10/23/2007 0   Final  . Basophils Absolute 10/23/2007 0.0   Final  . Prothrombin Time 10/23/2007 11.9   Final  . INR 10/23/2007 0.9   Final  . aPTT 10/23/2007 26   Final  . Color, Urine 10/23/2007 YELLOW   Final  . APPearance 10/23/2007 CLEAR   Final  . Specific Gravity, Urine 10/23/2007 1.015   Final  . pH 10/23/2007 6.5   Final  . Glucose, UA 10/23/2007 NEGATIVE   Final  . Hgb urine dipstick 10/23/2007 NEGATIVE   Final  . Bilirubin Urine 10/23/2007 NEGATIVE   Final  . Ketones, ur 10/23/2007 NEGATIVE   Final  . Protein, ur  10/23/2007 NEGATIVE   Final  . Urobilinogen, UA 10/23/2007 0.2   Final  . Nitrite 10/23/2007 NEGATIVE   Final  . Leukocytes, UA 10/23/2007 NEGATIVE MICROSCOPIC NOT DONE ON URINES WITH NEGATIVE PROTEIN, BLOOD, LEUKOCYTES, NITRITE, OR GLUCOSE <1000 mg/dL.   Final  . Specimen Description 10/23/2007 URINE, CLEAN CATCH   Final  . Special Requests 10/23/2007 NONE   Final  . Colony Count 10/23/2007 60,000 COLONIES/ML   Final  . Culture 10/23/2007 Multiple bacterial morphotypes  present, none predominant. Suggest appropriate recollection if clinically indicated.   Final  . Report Status 10/23/2007 10/25/2007 FINAL   Final  . Sodium 10/25/2007 135   Final  . Potassium 10/25/2007 3.7   Final  . Chloride 10/25/2007 103   Final  . CO2 10/25/2007 27   Final  . Glucose, Bld 10/25/2007 129*  Final  . BUN 10/25/2007 11   Final  . Creatinine, Ser 10/25/2007 0.89   Final  . Calcium 10/25/2007 8.5   Final  . GFR calc non Af Amer 10/25/2007 >60   Final  . GFR calc Af Wyvonnia Lora 10/25/2007    Final                   Value:>60                                The eGFR has been calculated                         using the MDRD equation.                         This calculation has not been                         validated in all clinical  . WBC 10/25/2007 6.4   Final  . RBC 10/25/2007 3.40*  Final  . Hemoglobin 10/25/2007 10.7*  Final  . HCT 10/25/2007 31.3*  Final  . MCV 10/25/2007 91.9   Final  . MCHC 10/25/2007 34.2   Final  . RDW 10/25/2007 12.8   Final  . Platelets 10/25/2007 203   Final  . Prothrombin Time 10/25/2007 13.4   Final  . INR 10/25/2007 1.0   Final  . Sodium 10/26/2007 136   Final  . Potassium 10/26/2007 3.0*  Final  . Chloride 10/26/2007 104   Final  . CO2 10/26/2007 28   Final  . Glucose, Bld 10/26/2007 129*  Final  . BUN 10/26/2007 7   Final  . Creatinine, Ser 10/26/2007 0.89   Final  . Calcium 10/26/2007 8.1*  Final  . GFR calc non Af Amer 10/26/2007 >60   Final  . GFR calc Af Wyvonnia Lora 10/26/2007    Final                   Value:>60                                The eGFR has been calculated                         using the MDRD equation.  This calculation has not been                         validated in all clinical  . WBC 10/26/2007 6.5   Final  . RBC 10/26/2007 3.08*  Final  . Hemoglobin 10/26/2007 9.7*  Final  . HCT 10/26/2007 28.4*  Final  . MCV 10/26/2007 92.3   Final  . MCHC 10/26/2007 34.1   Final  . RDW 10/26/2007  13.1   Final  . Platelets 10/26/2007 184   Final  . Prothrombin Time 10/26/2007 17.6*  Final  . INR 10/26/2007 1.4   Final  . Sodium 10/27/2007 137   Final  . Potassium 10/27/2007 4.1   Final  . Chloride 10/27/2007 106   Final  . CO2 10/27/2007 26   Final  . Glucose, Bld 10/27/2007 83   Final  . BUN 10/27/2007 9   Final  . Creatinine, Ser 10/27/2007 0.88   Final  . Calcium 10/27/2007 8.5   Final  . GFR calc non Af Amer 10/27/2007 >60   Final  . GFR calc Af Wyvonnia Lora 10/27/2007    Final                   Value:>60                                The eGFR has been calculated                         using the MDRD equation.                         This calculation has not been                         validated in all clinical  . WBC 10/27/2007 5.6   Final  . RBC 10/27/2007 2.87*  Final  . Hemoglobin 10/27/2007 9.0*  Final  . HCT 10/27/2007 26.6*  Final  . MCV 10/27/2007 92.6   Final  . MCHC 10/27/2007 33.9   Final  . RDW 10/27/2007 13.5   Final  . Platelets 10/27/2007 192   Final  . Prothrombin Time 10/27/2007 25.3*  Final  . INR 10/27/2007 2.2*  Final     Imaging: No results found.  Speciality Comments: No specialty comments available.    Procedures:  Large Joint Inj Date/Time: 02/16/2017 3:30 PM Performed by: Eliezer Lofts Authorized by: Eliezer Lofts   Consent Given by:  Patient Site marked: the procedure site was marked   Timeout: prior to procedure the correct patient, procedure, and site was verified   Indications:  Pain Location:  Hip Site:  R greater trochanter Prep: patient was prepped and draped in usual sterile fashion   Needle Size:  27 G Approach:  Superior Ultrasound Guidance: No   Fluoroscopic Guidance: No   Arthrogram: No   Medications:  1.5 mL lidocaine 1 %; 40 mg triamcinolone acetonide 40 MG/ML Aspiration Attempted: Yes   Aspirate amount (mL):  0 Patient tolerance:  Patient tolerated the procedure well with no immediate complications  Left  greater than right trochanter bursitis: She rates her discomfort on the left side as 10 on a scale of 0-10 and the right side as 8 on a scale of 0-10.  The pain is so  severe, patient is unable to sleep   Large Joint Inj Date/Time: 02/16/2017 3:31 PM Performed by: Eliezer Lofts Authorized by: Eliezer Lofts   Consent Given by:  Patient Site marked: the procedure site was marked   Timeout: prior to procedure the correct patient, procedure, and site was verified   Indications:  Pain Location:  Hip Site:  L greater trochanter Prep: patient was prepped and draped in usual sterile fashion   Needle Size:  27 G Approach:  Superior Ultrasound Guidance: No   Fluoroscopic Guidance: No   Arthrogram: No   Medications:  1.5 mL lidocaine 1 %; 40 mg triamcinolone acetonide 40 MG/ML Aspiration Attempted: Yes   Aspirate amount (mL):  0 Patient tolerance:  Patient tolerated the procedure well with no immediate complications  Left greater than right trochanter bursitis: She rates her discomfort on the left side as 10 on a scale of 0-10 and the right side as 8 on a scale of 0-10.  The pain is so severe, patient is unable to sleep     Allergies: Patient has no known allergies.   Assessment / Plan:     Visit Diagnoses: Rheumatoid arthritis involving multiple sites with positive rheumatoid factor (HCC)  High risk medications (not anticoagulants) long-term use - Plan: CBC with Differential/Platelet, COMPLETE METABOLIC PANEL WITH GFR  Bilateral primary osteoarthritis of knee  History of bilateral total hip arthroplasty  History of total knee replacement, unspecified laterality  Plantar fasciitis  History of ankle fracture/ bilateral  - Plan: DG Bone Density  DDD (degenerative disc disease), cervical  DDD (degenerative disc disease), lumbar  Other fatigue  Primary insomnia  Fibromyalgia  Trapezius muscle spasm  History of renal insufficiency  History of depression  Vitamin D  deficiency - Plan: VITAMIN D 25 Hydroxy (Vit-D Deficiency, Fractures)  Postmenopausal - Plan: DG Bone Density  Greater trochanteric bursitis of both hips - Plan: Large Joint Injection/Arthrocentesis, Large Joint Injection/Arthrocentesis, lidocaine (XYLOCAINE) 1 % (with pres) injection 1.5 mL, lidocaine (XYLOCAINE) 1 % (with pres) injection 1.5 mL, triamcinolone acetonide (KENALOG-40) injection 40 mg, triamcinolone acetonide (KENALOG-40) injection 40 mg   Plan: #1: Rheumatoid arthritis with rheumatoid factor positive. Doing well. No synovitis on examination. Patient taking Plaquenil as prescribed without any problems and adequate response  #2: High risk prescription. We need to update her blood work today and we will order CBC with differential CMP with GFR. Because she has a recent bilateral ankle fracture, we need to update her bone density and therefore I will order vitamin D also.  #3: Fibromyalgia syndrome. Rated 10 on a scale of 0-10. Patient is currently flaring. I suspect this is occurring secondary to her recent bilateral ankle fracture and surgery. She has had trouble ambulating due to her ankle pain.  #4: Bilateral greater trochanter bursitis. Please see procedure note for full details.  #5: History of pneumonia that started about August and September 2017. By the time the patient saw [place] in October, she was doing better but she still has a lingering cough. She has an appointment for pulmonologist but had to cancel that appointment when she had bilateral ankle fracture. Because she was uncertain when she would be able to recover from the ankle injury, she has not made a follow-up appointment. About 2 weeks ago, she was able to ambulate and now feels comfortable making an appointment with the pulmonologist to resolve her ongoing cough symptoms.  #6: CBC with differential, CMP with GFR, vitamin D  #7: Her last bone density was  2014. Due to the recent bilateral ankle fracture, I  will order the DEXA at [facility]. I give her copy of the previous DEXA as well as the order. Patient will call their office and schedule something within 1 week to one month from now. Note that she has a history of osteopenia with a T score of -2.0 on the last lab.  #8: Return to clinic in 5 months  #9: We recently refilled her Plaquenil and therefore that does not need to be refilled at this time. I will refill her Robaxin, 90 day supply with a refill I will refill her Ambien 10 mg, 90 day supply with a refill Note: Patient is having trouble sleeping and felt like she needs a stronger sleep medicine. I told her most of her pain is from bilateral greater trochanter bursa and she may benefit from having that treated and that may allow her to sleep through the night therefore reduce her overall pain. Patient is agreeable  #10 patient's pain management doctor is no longer working. Therefore patient is unable to get her medication. I advised the patient to follow with her PCP to manage her pain. Patient understands and is agreeable. Patient also can get a referral from her PCP or an alternate pain management doctor if they are unable to fill her pain medications.  Orders: Orders Placed This Encounter  Procedures  . Large Joint Injection/Arthrocentesis  . Large Joint Injection/Arthrocentesis  . DG Bone Density  . CBC with Differential/Platelet  . COMPLETE METABOLIC PANEL WITH GFR  . VITAMIN D 25 Hydroxy (Vit-D Deficiency, Fractures)   Meds ordered this encounter  Medications  . methocarbamol (ROBAXIN) 500 MG tablet    Sig: Take 1 tablet (500 mg total) by mouth 2 (two) times daily. AVOID USING AT NIGHT. ONLY FOR DAYTIME USE AS MUSCLE RELAXER    Dispense:  60 tablet    Refill:  5    Order Specific Question:   Supervising Provider    Answer:   Bo Merino [[id number]]  . zolpidem (AMBIEN) 10 MG tablet    Sig: Take 1 tablet (10 mg total) by mouth at bedtime.    Dispense:  90 tablet    Refill:  0     Not to exceed 5 additional fills before 12/26/2016.    Order Specific Question:   Supervising Provider    Answer:   Lyda Perone  . lidocaine (XYLOCAINE) 1 % (with pres) injection 1.5 mL  . lidocaine (XYLOCAINE) 1 % (with pres) injection 1.5 mL  . triamcinolone acetonide (KENALOG-40) injection 40 mg  . triamcinolone acetonide (KENALOG-40) injection 40 mg    Face-to-face time spent with patient was 40 minutes. 50% of time was spent in counseling and coordination of care.  Follow-Up Instructions: Return in about 5 months (around 07/19/2017) for Rheumatoid arthritis,FMS,bil ankle fx & pain,openia hx - dexa now.   Bo Merino, MD  Note - This record has been created using Editor, commissioning.  Chart creation errors have been sought, but may not always  have been located. Such creation errors do not reflect on  the standard of medical care.

## 2017-02-16 ENCOUNTER — Encounter: Payer: Self-pay | Admitting: Rheumatology

## 2017-02-16 ENCOUNTER — Ambulatory Visit (INDEPENDENT_AMBULATORY_CARE_PROVIDER_SITE_OTHER): Payer: 59 | Admitting: Rheumatology

## 2017-02-16 VITALS — BP 130/80 | HR 80 | Resp 12 | Ht 60.0 in | Wt 152.0 lb

## 2017-02-16 DIAGNOSIS — Z8781 Personal history of (healed) traumatic fracture: Secondary | ICD-10-CM

## 2017-02-16 DIAGNOSIS — M7062 Trochanteric bursitis, left hip: Secondary | ICD-10-CM | POA: Diagnosis not present

## 2017-02-16 DIAGNOSIS — M0579 Rheumatoid arthritis with rheumatoid factor of multiple sites without organ or systems involvement: Secondary | ICD-10-CM

## 2017-02-16 DIAGNOSIS — R5383 Other fatigue: Secondary | ICD-10-CM

## 2017-02-16 DIAGNOSIS — M5136 Other intervertebral disc degeneration, lumbar region: Secondary | ICD-10-CM

## 2017-02-16 DIAGNOSIS — M722 Plantar fascial fibromatosis: Secondary | ICD-10-CM | POA: Diagnosis not present

## 2017-02-16 DIAGNOSIS — F5101 Primary insomnia: Secondary | ICD-10-CM

## 2017-02-16 DIAGNOSIS — M62838 Other muscle spasm: Secondary | ICD-10-CM

## 2017-02-16 DIAGNOSIS — M7061 Trochanteric bursitis, right hip: Secondary | ICD-10-CM | POA: Diagnosis not present

## 2017-02-16 DIAGNOSIS — M5030 Other cervical disc degeneration, unspecified cervical region: Secondary | ICD-10-CM

## 2017-02-16 DIAGNOSIS — Z96643 Presence of artificial hip joint, bilateral: Secondary | ICD-10-CM

## 2017-02-16 DIAGNOSIS — Z87448 Personal history of other diseases of urinary system: Secondary | ICD-10-CM

## 2017-02-16 DIAGNOSIS — Z96659 Presence of unspecified artificial knee joint: Secondary | ICD-10-CM | POA: Diagnosis not present

## 2017-02-16 DIAGNOSIS — M170 Bilateral primary osteoarthritis of knee: Secondary | ICD-10-CM | POA: Diagnosis not present

## 2017-02-16 DIAGNOSIS — Z79899 Other long term (current) drug therapy: Secondary | ICD-10-CM

## 2017-02-16 DIAGNOSIS — M797 Fibromyalgia: Secondary | ICD-10-CM | POA: Diagnosis not present

## 2017-02-16 DIAGNOSIS — Z780 Asymptomatic menopausal state: Secondary | ICD-10-CM

## 2017-02-16 DIAGNOSIS — E559 Vitamin D deficiency, unspecified: Secondary | ICD-10-CM

## 2017-02-16 DIAGNOSIS — Z8659 Personal history of other mental and behavioral disorders: Secondary | ICD-10-CM

## 2017-02-16 LAB — CBC WITH DIFFERENTIAL/PLATELET
BASOS PCT: 0 %
Basophils Absolute: 0 cells/uL (ref 0–200)
EOS PCT: 2 %
Eosinophils Absolute: 112 cells/uL (ref 15–500)
HCT: 39 % (ref 35.0–45.0)
Hemoglobin: 12.5 g/dL (ref 11.7–15.5)
LYMPHS ABS: 1456 {cells}/uL (ref 850–3900)
LYMPHS PCT: 26 %
MCH: 30.3 pg (ref 27.0–33.0)
MCHC: 32.1 g/dL (ref 32.0–36.0)
MCV: 94.7 fL (ref 80.0–100.0)
MPV: 9.1 fL (ref 7.5–12.5)
Monocytes Absolute: 560 cells/uL (ref 200–950)
Monocytes Relative: 10 %
Neutro Abs: 3472 cells/uL (ref 1500–7800)
Neutrophils Relative %: 62 %
Platelets: 319 10*3/uL (ref 140–400)
RBC: 4.12 MIL/uL (ref 3.80–5.10)
RDW: 13.1 % (ref 11.0–15.0)
WBC: 5.6 10*3/uL (ref 3.8–10.8)

## 2017-02-16 MED ORDER — ZOLPIDEM TARTRATE 10 MG PO TABS: 10.0000 mg | ORAL_TABLET | Freq: Every day | ORAL | 0 refills | Status: DC

## 2017-02-16 MED ORDER — TRIAMCINOLONE ACETONIDE 40 MG/ML IJ SUSP
40.0000 mg | INTRAMUSCULAR | Status: AC | PRN
Start: 2017-02-16 — End: 2017-02-16
  Administered 2017-02-16: 40 mg via INTRA_ARTICULAR

## 2017-02-16 MED ORDER — LIDOCAINE HCL 1 % IJ SOLN
1.5000 mL | INTRAMUSCULAR | Status: AC | PRN
  Administered 2017-02-16: 1.5 mL

## 2017-02-16 MED ORDER — TRIAMCINOLONE ACETONIDE 40 MG/ML IJ SUSP
40.0000 mg | INTRAMUSCULAR | Status: AC | PRN
  Administered 2017-02-16: 40 mg via INTRA_ARTICULAR

## 2017-02-16 MED ORDER — METHOCARBAMOL 500 MG PO TABS: 500.0000 mg | ORAL_TABLET | Freq: Two times a day (BID) | ORAL | 5 refills | Status: DC

## 2017-02-17 LAB — COMPLETE METABOLIC PANEL WITH GFR
ALT: 9 U/L (ref 6–29)
AST: 18 U/L (ref 10–35)
Albumin: 3.8 g/dL (ref 3.6–5.1)
Alkaline Phosphatase: 98 U/L (ref 33–130)
BUN: 18 mg/dL (ref 7–25)
CALCIUM: 9 mg/dL (ref 8.6–10.4)
CO2: 26 mmol/L (ref 20–31)
CREATININE: 1.2 mg/dL — AB (ref 0.50–0.99)
Chloride: 104 mmol/L (ref 98–110)
GFR, Est African American: 55 mL/min — ABNORMAL LOW (ref >=60)
GFR, Est Non African American: 48 mL/min — ABNORMAL LOW (ref >=60)
GLUCOSE: 85 mg/dL (ref 65–99)
POTASSIUM: 4.4 mmol/L (ref 3.5–5.3)
SODIUM: 139 mmol/L (ref 135–146)
Total Bilirubin: 0.3 mg/dL (ref 0.2–1.2)
Total Protein: 6.3 g/dL (ref 6.1–8.1)

## 2017-02-17 LAB — VITAMIN D 25 HYDROXY (VIT D DEFICIENCY, FRACTURES): Vit D, 25-Hydroxy: 36 ng/mL (ref 30–100)

## 2017-02-25 ENCOUNTER — Other Ambulatory Visit: Payer: Self-pay | Admitting: Rheumatology

## 2017-02-26 NOTE — Telephone Encounter (Signed)
Last Visit: 02/16/17 Next visit: 07/19/17 Labs: 02/16/17 CMP with GFR is within normal limits except for abnormal creatinine 1.20 and GFR low at 48. CBC WNL PLQ Eye Exam: 06/28/16 WNL  Okay to refill PLQ?

## 2017-04-16 ENCOUNTER — Other Ambulatory Visit: Payer: Self-pay | Admitting: Rheumatology

## 2017-04-16 NOTE — Telephone Encounter (Signed)
Last Visit: 02/16/17 Next visit: 07/19/17 Labs: 02/16/17 Stable PLQ Eye Exam: 06/28/16 WNL  Okay to refill per Dr. Corliss Skainseveshwar

## 2017-05-17 ENCOUNTER — Telehealth: Payer: Self-pay | Admitting: *Deleted

## 2017-05-17 NOTE — Telephone Encounter (Signed)
Bone Density Scan 02/23/17  T Scare - 2.7 Osteoporosis  Weight Bearing Exercises  Vitamin D RX Fosamax if needed

## 2017-05-31 ENCOUNTER — Other Ambulatory Visit: Payer: Self-pay | Admitting: *Deleted

## 2017-05-31 NOTE — Telephone Encounter (Signed)
Last Visit: 02/16/17 Next visit: 07/19/17  Okay to refill Ambien?

## 2017-05-31 NOTE — Telephone Encounter (Signed)
ok 

## 2017-06-01 MED ORDER — ZOLPIDEM TARTRATE 10 MG PO TABS: 10.0000 mg | ORAL_TABLET | Freq: Every day | ORAL | 0 refills | Status: DC

## 2017-07-07 NOTE — Progress Notes (Deleted)
Office Visit Note  Patient: Tammie [name]             Date of Birth: 1950-11-30           MRN: [id number]             PCP: Amelia Jo, FNP Referring: Amelia Jo, FNP Visit Date: 07/19/2017 Occupation: @    Subjective:  No chief complaint on file.   History of Present Illness: Tammie [name] is a 66 y.o. female ***   Activities of Daily Living:  Patient reports morning stiffness for *** {minute/hour:19697}.   Patient {ACTIONS;DENIES/REPORTS:21021675::"Denies"} nocturnal pain.  Difficulty dressing/grooming: {ACTIONS;DENIES/REPORTS:21021675::"Denies"} Difficulty climbing stairs: {ACTIONS;DENIES/REPORTS:21021675::"Denies"} Difficulty getting out of chair: {ACTIONS;DENIES/REPORTS:21021675::"Denies"} Difficulty using hands for taps, buttons, cutlery, and/or writing: {ACTIONS;DENIES/REPORTS:21021675::"Denies"}   No Rheumatology ROS completed.   PMFS History:  Patient Active Problem List   Diagnosis Date Noted  . DDD (degenerative disc disease), lumbar 02/08/2017  . DDD (degenerative disc disease), cervical 02/08/2017  . Primary osteoarthritis of both hips 02/08/2017  . Bilateral primary osteoarthritis of knee 02/08/2017  . History of renal insufficiency 02/08/2017  . Plantar fasciitis 02/08/2017  . History of bilateral total hip arthroplasty 02/08/2017  . History of total knee replacement 02/08/2017  . History of depression 02/08/2017  . Trapezius muscle spasm 07/18/2016  . Bilateral sacroiliitis (HCC) 07/18/2016  . Rheumatoid arthritis involving multiple sites with positive rheumatoid factor (HCC) 07/17/2016  . High risk medications (not anticoagulants) long-term use 07/17/2016  . Fibromyalgia 07/17/2016  . Fatigue 07/17/2016  . Insomnia 07/17/2016    Past Medical History:  Diagnosis Date  . CRI (chronic renal insufficiency)   . DDD (degenerative disc disease), cervical   . DDD (degenerative disc disease), lumbar   . Depression     . Fibromyalgia   . OA (osteoarthritis) of knee   . Osteoarthritis of hip   . Plantar fasciitis, bilateral   . Rheumatoid arthritis (HCC)     No family history on file. Past Surgical History:  Procedure Laterality Date  . REPLACEMENT TOTAL KNEE Bilateral   . TOTAL HIP ARTHROPLASTY Bilateral    Social History   Social History Narrative  . No narrative on file     Objective: Vital Signs: There were no vitals taken for this visit.   Physical Exam   Musculoskeletal Exam: ***  CDAI Exam: No CDAI exam completed.    Investigation: No additional findings. PLQ eye exam: 06/2016 CBC Latest Ref Rng & Units 02/16/2017 10/27/2007 10/26/2007  WBC 3.8 - 10.8 K/uL 5.6 5.6 6.5  Hemoglobin 11.7 - 15.5 g/dL 40.9 9.0(L) 9.7(L)  Hematocrit 35.0 - 45.0 % 39.0 26.6(L) 28.4(L)  Platelets 140 - 400 K/uL 319 192 184   CMP Latest Ref Rng & Units 02/16/2017 10/27/2007 10/26/2007  Glucose 65 - 99 mg/dL 85 83 811(B)  BUN 7 - 25 mg/dL Creatinine 0.50 - 0.99 mg/dL 1.47(W) 2.95 6.21  Sodium 135 - 146 mmol/L 139 137 136  Potassium 3.5 - 5.3 mmol/L 4.4 4.1 3.0(L)  Chloride 98 - 110 mmol/L 104 106 104  CO2 20 - 31 mmol/L Calcium 8.6 - 10.4 mg/dL 9.0 8.5 3.0(Q)  Total Protein 6.1 - 8.1 g/dL 6.3 - -  Total Bilirubin 0.2 - 1.2 mg/dL 0.3 - -  Alkaline Phos 33 - 130 U/L 98 - -  AST 10 - 35 U/L 18 - -  ALT 6 - 29 U/L 9 - -    Imaging:  No results found.  Speciality Comments: No specialty comments available.    Procedures:  No procedures performed Allergies: Patient has no known allergies.   Assessment / Plan:     Visit Diagnoses: Rheumatoid arthritis involving multiple sites with positive rheumatoid factor (HCC)  High risk medication use - PLQ. PLQ eye exam: 06/2016  Primary osteoarthritis of both hands  History of bilateral hip replacements  History of bilateral knee replacement  Plantar fasciitis  DDD (degenerative disc disease), cervical  DDD (degenerative disc  disease), lumbar  Fibromyalgia - Robaxin  History of insomnia - Ambien 10 mg po qhs  History of fatigue  History of depression  History of renal insufficiency  History of vitamin D deficiency    Orders: No orders of the defined types were placed in this encounter.  No orders of the defined types were placed in this encounter.   Face-to-face time spent with patient was *** minutes. 50% of time was spent in counseling and coordination of care.  Follow-Up Instructions: No Follow-up on file.   Pollyann Savoy, MD  Note - This record has been created using Animal nutritionist.  Chart creation errors have been sought, but may not always  have been located. Such creation errors do not reflect on  the standard of medical care.

## 2017-07-19 ENCOUNTER — Ambulatory Visit: Payer: Medicare Other | Admitting: Rheumatology

## 2017-07-19 ENCOUNTER — Other Ambulatory Visit: Payer: Self-pay | Admitting: Rheumatology

## 2017-07-19 NOTE — Telephone Encounter (Signed)
Left message for patient to call the office to reschedule appointment

## 2017-07-19 NOTE — Telephone Encounter (Signed)
Last Visit: 02/16/17 Next visit: 07/19/17 Labs: 02/16/17 Creat 1.20 GFR 48 Previously normal PLQ Eye Exam: 06/28/16 WNL  Okay to refill PLQ?

## 2017-07-19 NOTE — Telephone Encounter (Signed)
Patient no-show today. Okay to refill Plaquenil. Please reschedule appointment.

## 2017-08-24 ENCOUNTER — Telehealth (INDEPENDENT_AMBULATORY_CARE_PROVIDER_SITE_OTHER): Payer: Self-pay

## 2017-08-24 NOTE — Telephone Encounter (Signed)
Young with [facility] was calling for a verbal for a Rx for Ambien.  Cb# [phone]. Fax# [phone]. Reference# [id number].  Please advise.  Thank you.

## 2017-08-27 NOTE — Telephone Encounter (Signed)
Last Visit: 02/16/17 Next Visit was due October 2018. Patient no showed her appointment. Message sent to front to reschedule patient.  Okay to refill 30 day supply per Dr. Corliss Skainseveshwar   Verbal prescription given .

## 2017-09-07 ENCOUNTER — Other Ambulatory Visit: Payer: Self-pay | Admitting: Rheumatology

## 2017-09-07 NOTE — Telephone Encounter (Addendum)
Last Visit: 02/16/17 Next visit: 09/24/17 Labs: 02/16/17 Creat 1.20 GFR 48 Previously normal PLQ Eye Exam: 06/28/16 WNL  Patient advised she needs to update PLQ eye exam and labs. Patient will update labs at  Her appointment  Okay to refill 30 day supply PLQ?

## 2017-09-11 NOTE — Progress Notes (Signed)
Office Visit Note  Patient: Tammie PottKrystyna E Shimp             Date of Birth: 18-Jun-1951           MRN: [id number]             PCP: Amelia Joousins, Melissa A, FNP Referring: Amelia Joousins, Melissa A, FNP Visit Date: 09/24/2017 Occupation: @GUAROCC @    Subjective:  Pain in multiple joints   History of Present Illness: Tammie [name] is a 66 y.o. female with history of seropositive rheumatoid arthritis, osteoarthritis, DDD, and fibromyalgia.  Patient states she has been in severe 10/10 pain for the past 1 month.  She states she has occasional pain in her hands.  She has pain in her hands, wrists, right shoulder, left elbow, hips, knees, and ankles.  She states her pain is most severe in her right SI joint.  She describes the pain as sharp and constant.  She states she the pain has made it difficult to walk and lay flat.  She states she has increased fatigue and insomnia.  She has been unable to exercise the past month due to her level of pain.  She states she continues to take Plaquenil and reports her eye exam was in the summer of 2018.   Dr. Naoma DienerKoala     Activities of Daily Living:  Patient reports morning stiffness for all day.   Patient Reports nocturnal pain.  Difficulty dressing/grooming: Reports Difficulty climbing stairs: Reports Difficulty getting out of chair: Reports Difficulty using hands for taps, buttons, cutlery, and/or writing: Reports   Review of Systems  Constitutional: Positive for fatigue. Negative for weakness.  HENT: Negative for mouth sores, mouth dryness and nose dryness.   Eyes: Negative for redness and dryness.  Respiratory: Negative for cough, hemoptysis, shortness of breath and difficulty breathing.   Cardiovascular: Negative for chest pain, palpitations, hypertension and swelling in legs/feet.  Gastrointestinal: Positive for constipation. Negative for blood in stool and diarrhea.  Endocrine: Negative for increased urination.  Genitourinary: Negative for painful  urination.  Musculoskeletal: Positive for arthralgias, joint pain, joint swelling, morning stiffness and muscle tenderness. Negative for myalgias, muscle weakness and myalgias.  Skin: Negative for pallor, rash, hair loss, nodules/bumps, redness, skin tightness, ulcers and sensitivity to sunlight.  Allergic/Immunologic: Negative for susceptible to infections.  Neurological: Negative for dizziness, numbness and headaches.  Hematological: Negative for swollen glands.  Psychiatric/Behavioral: Positive for sleep disturbance. Negative for depressed mood. The patient is nervous/anxious.     PMFS History:  Patient Active Problem List   Diagnosis Date Noted  . DDD (degenerative disc disease), lumbar 02/08/2017  . DDD (degenerative disc disease), cervical 02/08/2017  . Primary osteoarthritis of both hips 02/08/2017  . Bilateral primary osteoarthritis of knee 02/08/2017  . History of renal insufficiency 02/08/2017  . Plantar fasciitis 02/08/2017  . History of bilateral total hip arthroplasty 02/08/2017  . History of total knee replacement 02/08/2017  . History of depression 02/08/2017  . Trapezius muscle spasm 07/18/2016  . Bilateral sacroiliitis (HCC) 07/18/2016  . Rheumatoid arthritis involving multiple sites with positive rheumatoid factor (HCC) 07/17/2016  . High risk medications (not anticoagulants) long-term use 07/17/2016  . Fibromyalgia 07/17/2016  . Fatigue 07/17/2016  . Insomnia 07/17/2016    Past Medical History:  Diagnosis Date  . CRI (chronic renal insufficiency)   . DDD (degenerative disc disease), cervical   . DDD (degenerative disc disease), lumbar   . Depression   . Fibromyalgia   . OA (osteoarthritis) of knee   .  Osteoarthritis of hip   . Plantar fasciitis, bilateral   . Rheumatoid arthritis (HCC)     History reviewed. No pertinent family history. Past Surgical History:  Procedure Laterality Date  . APPENDECTOMY    . CESAREAN SECTION     x2  . ELBOW SURGERY    .  JOINT REPLACEMENT     BIL knee   . JOINT REPLACEMENT     BIL hip  . KNEE ARTHROPLASTY    . NECK SURGERY    . REPLACEMENT TOTAL KNEE Bilateral   . TOTAL HIP ARTHROPLASTY Bilateral    Social History   Social History Narrative  . Not on file     Objective: Vital Signs: BP (!) 142/90 (BP Location: Right Arm, Patient Position: Sitting, Cuff Size: Normal)   Pulse 94   Resp 17   Ht 5\' 6"  (1.676 m)   Wt 162 lb (73.5 kg)   BMI 26.15 kg/m    Physical Exam  Constitutional: She is oriented to person, place, and time. She appears well-developed and well-nourished.  HENT:  Head: Normocephalic and atraumatic.  Eyes: Conjunctivae and EOM are normal.  Neck: Normal range of motion.  Cardiovascular: Normal rate, regular rhythm, normal heart sounds and intact distal pulses.  Pulmonary/Chest: Effort normal and breath sounds normal.  Abdominal: Soft. Bowel sounds are normal.  Lymphadenopathy:    She has no cervical adenopathy.  Neurological: She is alert and oriented to person, place, and time.  Skin: Skin is warm and dry. Capillary refill takes less than 2 seconds.  Psychiatric: She has a normal mood and affect. Her behavior is normal.  Nursing note and vitals reviewed.    Musculoskeletal Exam: Generalized hyperalgesia on exam. C-spine, thoracic, and lumbar limited ROM with discomfort.  Shoulder joints, elbow joints, and wrist joints good ROM.  MCPs, PIPs, and DIPs good ROM with no synovitis.  Complete fist formation.  Right SI joint tenderness. Severe pain with internal and external rotation of right hip. Knees and ankles good ROM with no synovitis. No effusion or warmth of knees.  MTPs, PIPs, and DIPs good ROM with no synovitis.     CDAI Exam: CDAI Homunculus Exam:   Joint Counts:  CDAI Tender Joint count: 0 CDAI Swollen Joint count: 0  Global Assessments:  Patient Global Assessment: 10 Provider Global Assessment: 1  CDAI Calculated Score: 11    Investigation: No additional  findings. CBC Latest Ref Rng & Units 02/16/2017 10/27/2007 10/26/2007  WBC 3.8 - 10.8 K/uL 5.6 5.6 6.5  Hemoglobin 11.7 - 15.5 g/dL 52.8 9.0(L) 9.7(L)  Hematocrit 35.0 - 45.0 % 39.0 26.6(L) 28.4(L)  Platelets 140 - 400 K/uL 319 192 184   CMP Latest Ref Rng & Units 02/16/2017 10/27/2007 10/26/2007  Glucose 65 - 99 mg/dL 85 83 413(K)  BUN 7 - 25 mg/dL 18 9 7   Creatinine 0.50 - 0.99 mg/dL 4.40(N) 0.27 2.53  Sodium 135 - 146 mmol/L 139 137 136  Potassium 3.5 - 5.3 mmol/L 4.4 4.1 3.0(L)  Chloride 98 - 110 mmol/L 104 106 104  CO2 20 - 31 mmol/L 26 26 28   Calcium 8.6 - 10.4 mg/dL 9.0 8.5 6.6(Y)  Total Protein 6.1 - 8.1 g/dL 6.3 - -  Total Bilirubin 0.2 - 1.2 mg/dL 0.3 - -  Alkaline Phos 33 - 130 U/L 98 - -  AST 10 - 35 U/L 18 - -  ALT 6 - 29 U/L 9 - -    Imaging: Xr Lumbar Spine 2-3 Views  Result  Date: 09/24/2017 Severe levo-scoliosis with multi-level spondylosis. Anterior spurring was noted. L3-4, L4-5, L5-S1 most severe narrowing. Impression: severe spondylosis and scoliosis.  Xr Pelvis 1-2 Views  Result Date: 09/24/2017 No SI joint changes were noted. Bilateral prosthesis noted in the hip joints. There is ware in the polyethylene lining of the acetabulum due to supero-lateral migration of the right femoral head of the prosthesis. Left protheses is in place.   Speciality Comments: No specialty comments available.    Procedures:  Sacroiliac Joint Inj on 09/24/2017 9:08 AM Indications: pain Details: 27 G 1.5 in needle, posterior approach Medications: 1 mL lidocaine 1 %; 40 mg triamcinolone acetonide 40 MG/ML Aspirate: 0 mL Outcome: tolerated well, no immediate complications Procedure, treatment alternatives, risks and benefits explained, specific risks discussed. Consent was given by the patient. Immediately prior to procedure a time out was called to verify the correct patient, procedure, equipment, support staff and site/side marked as required. Patient was prepped and draped in the  usual sterile fashion.     Allergies: Patient has no known allergies.   Assessment / Plan:     Visit Diagnoses: Rheumatoid arthritis involving multiple sites with positive rheumatoid factor [facility](HCC): Patient has no synovitis on exam.  She will continue on PLQ 200 mg PO BID M-F.    High risk medications (not anticoagulants) long-term use - PLQ: Patient is going to call Ophthalmologist to have eye exam form faxed.  She was given a PLQ eye exam form today in the office.  CBC and CMP were drawn today.  She will have routine labs in 5 months. - Plan: CBC with Differential/Platelet, COMPLETE METABOLIC PANEL WITH GFR  Fibromyalgia: Generalized hyperalgesia on exam.  She has muscle tenderness and pain in multiple joints.  She has been flaring for the past 1 month.    Other fatigue: Continues to have fatigue.  She has been unable to work out lately due her pain.  Primary insomnia: Her insomnia has been worsening due to her level of pain.  Good sleep hygiene was discussed.  She takes Ambien 10 mg at bedtime.  SI joint pain, right side -  Patient has severe pain of her right SI joint.  A SI joint cortisone injection was performed today.  She tolerated the procedure well.  Plan: Sacroiliac Joint Inj, XR Lumbar Spine 2-3 Views, XR Pelvis 1-2 Views   DDD (degenerative disc disease), cervical: Limited ROM with discomfort.    DDD (degenerative disc disease), lumbar: Scoliosis noted on XR today.  No midline spinal tenderness.    History of bilateral total hip arthroplasty: Bilateral hip replacements performed by Dr. Cleophas DunkerWhitfield.  She is has severe pain with ROM of her right hip.  She is also walking with a limp.  She is going to follow up with Dr. Cleophas DunkerWhitfield regarding the protrusion of her right femoral head prosthesis in the acetabulum.  History of total bilateral knee replacement: Doing well.  No warmth or effusion.    Plantar fasciitis: Doing better.  Other medical conditions are listed as follows:    History of depression  History of renal insufficiency    Orders: Orders Placed This Encounter  Procedures  . Sacroiliac Joint Inj  . XR Lumbar Spine 2-3 Views  . XR Pelvis 1-2 Views  . CBC with Differential/Platelet  . COMPLETE METABOLIC PANEL WITH GFR   No orders of the defined types were placed in this encounter.   Face-to-face time spent with patient was 30 minutes. Greater than 50% of time was  spent in counseling and coordination of care.  Follow-Up Instructions: Return in about 4 months (around 01/22/2018) for Rheumatoid arthritis, Fibromyalgia.  Pollyann Savoy, MD Note - This record has been created using Animal nutritionist.  Chart creation errors have been sought, but may not always  have been located. Such creation errors do not reflect on  the standard of medical care.

## 2017-09-11 NOTE — Telephone Encounter (Signed)
ok 

## 2017-09-13 ENCOUNTER — Other Ambulatory Visit: Payer: Self-pay | Admitting: *Deleted

## 2017-09-13 NOTE — Telephone Encounter (Signed)
ok 

## 2017-09-13 NOTE — Telephone Encounter (Signed)
Last Visit: 02/16/17 Next visit: 07/19/17  Okay to refill Ambien?  

## 2017-09-14 ENCOUNTER — Telehealth: Payer: Self-pay | Admitting: Rheumatology

## 2017-09-14 MED ORDER — ZOLPIDEM TARTRATE 10 MG PO TABS: 10.0000 mg | ORAL_TABLET | Freq: Every day | ORAL | 0 refills | Status: DC

## 2017-09-14 NOTE — Telephone Encounter (Signed)
Spoke with Rene Kocheregina and gave verbal prescription, it was faxed today. They will process prescription.

## 2017-09-14 NOTE — Telephone Encounter (Signed)
Needs approval for Zolpidem refill.

## 2017-09-24 ENCOUNTER — Ambulatory Visit: Payer: 59 | Admitting: Rheumatology

## 2017-09-24 ENCOUNTER — Ambulatory Visit (INDEPENDENT_AMBULATORY_CARE_PROVIDER_SITE_OTHER): Payer: Medicare Other

## 2017-09-24 ENCOUNTER — Ambulatory Visit (INDEPENDENT_AMBULATORY_CARE_PROVIDER_SITE_OTHER): Payer: Self-pay

## 2017-09-24 ENCOUNTER — Encounter: Payer: Self-pay | Admitting: Rheumatology

## 2017-09-24 VITALS — BP 142/90 | HR 94 | Resp 17 | Ht 66.0 in | Wt 162.0 lb

## 2017-09-24 DIAGNOSIS — F5101 Primary insomnia: Secondary | ICD-10-CM

## 2017-09-24 DIAGNOSIS — M533 Sacrococcygeal disorders, not elsewhere classified: Secondary | ICD-10-CM

## 2017-09-24 DIAGNOSIS — M160 Bilateral primary osteoarthritis of hip: Secondary | ICD-10-CM | POA: Diagnosis not present

## 2017-09-24 DIAGNOSIS — M5136 Other intervertebral disc degeneration, lumbar region: Secondary | ICD-10-CM | POA: Diagnosis not present

## 2017-09-24 DIAGNOSIS — M545 Low back pain, unspecified: Secondary | ICD-10-CM

## 2017-09-24 DIAGNOSIS — M5030 Other cervical disc degeneration, unspecified cervical region: Secondary | ICD-10-CM | POA: Diagnosis not present

## 2017-09-24 DIAGNOSIS — Z87448 Personal history of other diseases of urinary system: Secondary | ICD-10-CM

## 2017-09-24 DIAGNOSIS — Z96653 Presence of artificial knee joint, bilateral: Secondary | ICD-10-CM | POA: Diagnosis not present

## 2017-09-24 DIAGNOSIS — M51369 Other intervertebral disc degeneration, lumbar region without mention of lumbar back pain or lower extremity pain: Secondary | ICD-10-CM

## 2017-09-24 DIAGNOSIS — M0579 Rheumatoid arthritis with rheumatoid factor of multiple sites without organ or systems involvement: Secondary | ICD-10-CM | POA: Diagnosis not present

## 2017-09-24 DIAGNOSIS — Z96643 Presence of artificial hip joint, bilateral: Secondary | ICD-10-CM

## 2017-09-24 DIAGNOSIS — M722 Plantar fascial fibromatosis: Secondary | ICD-10-CM | POA: Diagnosis not present

## 2017-09-24 DIAGNOSIS — M797 Fibromyalgia: Secondary | ICD-10-CM

## 2017-09-24 DIAGNOSIS — R5383 Other fatigue: Secondary | ICD-10-CM

## 2017-09-24 DIAGNOSIS — Z79899 Other long term (current) drug therapy: Secondary | ICD-10-CM | POA: Diagnosis not present

## 2017-09-24 DIAGNOSIS — M170 Bilateral primary osteoarthritis of knee: Secondary | ICD-10-CM

## 2017-09-24 DIAGNOSIS — Z8659 Personal history of other mental and behavioral disorders: Secondary | ICD-10-CM

## 2017-09-24 DIAGNOSIS — G8929 Other chronic pain: Secondary | ICD-10-CM

## 2017-09-24 LAB — COMPLETE METABOLIC PANEL WITH GFR
AG RATIO: 1.7 (calc) (ref 1.0–2.5)
ALBUMIN MSPROF: 4.1 g/dL (ref 3.6–5.1)
ALKALINE PHOSPHATASE (APISO): 92 U/L (ref 33–130)
ALT: 10 U/L (ref 6–29)
AST: 16 U/L (ref 10–35)
BILIRUBIN TOTAL: 0.3 mg/dL (ref 0.2–1.2)
BUN / CREAT RATIO: 18 (calc) (ref 6–22)
BUN: 22 mg/dL (ref 7–25)
CHLORIDE: 107 mmol/L (ref 98–110)
CO2: 26 mmol/L (ref 20–32)
Calcium: 9.1 mg/dL (ref 8.6–10.4)
Creat: 1.21 mg/dL — ABNORMAL HIGH (ref 0.50–0.99)
GFR, EST AFRICAN AMERICAN: 54 mL/min/{1.73_m2} — AB (ref >=60)
GFR, Est Non African American: 47 mL/min/{1.73_m2} — ABNORMAL LOW (ref >=60)
GLOBULIN: 2.4 g/dL (ref 1.9–3.7)
Glucose, Bld: 95 mg/dL (ref 65–99)
POTASSIUM: 4.5 mmol/L (ref 3.5–5.3)
SODIUM: 140 mmol/L (ref 135–146)
TOTAL PROTEIN: 6.5 g/dL (ref 6.1–8.1)

## 2017-09-24 LAB — CBC WITH DIFFERENTIAL/PLATELET
BASOS ABS: 49 {cells}/uL (ref 0–200)
BASOS PCT: 0.8 %
EOS ABS: 128 {cells}/uL (ref 15–500)
Eosinophils Relative: 2.1 %
HEMATOCRIT: 38.9 % (ref 35.0–45.0)
HEMOGLOBIN: 12.7 g/dL (ref 11.7–15.5)
Lymphs Abs: 1519 cells/uL (ref 850–3900)
MCH: 29.9 pg (ref 27.0–33.0)
MCHC: 32.6 g/dL (ref 32.0–36.0)
MCV: 91.5 fL (ref 80.0–100.0)
MONOS PCT: 10.1 %
MPV: 9.4 fL (ref 7.5–12.5)
NEUTROS ABS: 3788 {cells}/uL (ref 1500–7800)
Neutrophils Relative %: 62.1 %
Platelets: 339 10*3/uL (ref 140–400)
RBC: 4.25 10*6/uL (ref 3.80–5.10)
RDW: 13.1 % (ref 11.0–15.0)
Total Lymphocyte: 24.9 %
WBC: 6.1 10*3/uL (ref 3.8–10.8)
WBCMIX: 616 {cells}/uL (ref 200–950)

## 2017-09-24 MED ORDER — LIDOCAINE HCL 1 % IJ SOLN
1.0000 mL | INTRAMUSCULAR | Status: AC | PRN
  Administered 2017-09-24: 1 mL

## 2017-09-24 MED ORDER — TRIAMCINOLONE ACETONIDE 40 MG/ML IJ SUSP
40.0000 mg | INTRAMUSCULAR | Status: AC | PRN
  Administered 2017-09-24: 40 mg via INTRA_ARTICULAR

## 2017-09-24 NOTE — Patient Instructions (Signed)
Sacroiliac Joint Dysfunction Sacroiliac joint dysfunction is a condition that causes inflammation on one or both sides of the sacroiliac (SI) joint. The SI joint connects the lower part of the spine (sacrum) with the two upper portions of the pelvis (ilium). This condition causes deep aching or burning pain in the low back. In some cases, the pain may also spread into one or both buttocks or hips or spread down the legs. What are the causes? This condition may be caused by:  Pregnancy. During pregnancy, extra stress is put on the SI joints because the pelvis widens.  Injury, such as: ? Car accidents. ? Sport-related injuries. ? Work-related injuries.  Having one leg that is shorter than the other.  Conditions that affect the joints, such as: ? Rheumatoid arthritis. ? Gout. ? Psoriatic arthritis. ? Joint infection (septic arthritis).  Sometimes, the cause of SI joint dysfunction is not known. What are the signs or symptoms? Symptoms of this condition include:  Aching or burning pain in the lower back. The pain may also spread to other areas, such as: ? Buttocks. ? Groin. ? Thighs and legs.  Muscle spasms in or around the painful areas.  Increased pain when standing, walking, running, stair climbing, bending, or lifting.  How is this diagnosed? Your health care provider will do a physical exam and take your medical history. During the exam, the health care provider may move one or both of your legs to different positions to check for pain. Various tests may be done to help verify the diagnosis, including:  Imaging tests to look for other causes of pain. These may include: ? MRI. ? CT scan. ? Bone scan.  Diagnostic injection. A numbing medicine is injected into the SI joint using a needle. If the pain is temporarily improved or stopped after the injection, this can indicate that SI joint dysfunction is the problem.  How is this treated? Treatment may vary depending on the  cause and severity of your condition. Treatment options may include:  Applying ice or heat to the lower back area. This can help to reduce pain and muscle spasms.  Medicines to relieve pain or inflammation or to relax the muscles.  Wearing a back brace (sacroiliac brace) to help support the joint while your back is healing.  Physical therapy to increase muscle strength around the joint and flexibility at the joint. This may also involve learning proper body positions and ways of moving to relieve stress on the joint.  Direct manipulation of the SI joint.  Injections of steroid medicine into the joint in order to reduce pain and swelling.  Radiofrequency ablation to burn away nerves that are carrying pain messages from the joint.  Use of a device that provides electrical stimulation in order to reduce pain at the joint.  Surgery to put in screws and plates that limit or prevent joint motion. This is rare.  Follow these instructions at home:  Rest as needed. Limit your activities as directed by your health care provider.  Take medicines only as directed by your health care provider.  If directed, apply ice to the affected area: ? Put ice in a plastic bag. ? Place a towel between your skin and the bag. ? Leave the ice on for 20 minutes, 2-3 times per day.  Use a heating pad or a moist heat pack as directed by your health care provider.  Exercise as directed by your health care provider or physical therapist.  Keep all follow-up visits   as directed by your health care provider. This is important. Contact a health care provider if:  Your pain is not controlled with medicine.  You have a fever.  You have increasingly severe pain. Get help right away if:  You have weakness, numbness, or tingling in your legs or feet.  You lose control of your bladder or bowel. This information is not intended to replace advice given to you by your health care provider. Make sure you discuss  any questions you have with your health care provider. Document Released: 12/08/2008 Document Revised: 02/17/2016 Document Reviewed: 05/19/2014 Elsevier Interactive Patient Education  2018 [employer]  

## 2017-09-26 NOTE — Progress Notes (Signed)
All labs are stable

## 2017-09-27 ENCOUNTER — Ambulatory Visit (INDEPENDENT_AMBULATORY_CARE_PROVIDER_SITE_OTHER): Payer: Medicare Other | Admitting: Orthopaedic Surgery

## 2017-09-27 ENCOUNTER — Encounter (INDEPENDENT_AMBULATORY_CARE_PROVIDER_SITE_OTHER): Payer: Self-pay | Admitting: Orthopaedic Surgery

## 2017-09-27 VITALS — BP 176/80 | HR 81 | Resp 18 | Ht 66.0 in | Wt 157.0 lb

## 2017-09-27 DIAGNOSIS — M797 Fibromyalgia: Secondary | ICD-10-CM

## 2017-09-27 DIAGNOSIS — T84050A Periprosthetic osteolysis of internal prosthetic right hip joint, initial encounter: Secondary | ICD-10-CM | POA: Diagnosis not present

## 2017-09-27 DIAGNOSIS — M4156 Other secondary scoliosis, lumbar region: Secondary | ICD-10-CM

## 2017-09-27 DIAGNOSIS — M5136 Other intervertebral disc degeneration, lumbar region: Secondary | ICD-10-CM

## 2017-09-27 MED ORDER — LIDOCAINE HCL 1 % IJ SOLN
2.0000 mL | INTRAMUSCULAR | Status: AC | PRN
  Administered 2017-09-27: 2 mL

## 2017-09-27 MED ORDER — METHYLPREDNISOLONE ACETATE 40 MG/ML IJ SUSP
40.0000 mg | INTRAMUSCULAR | Status: AC | PRN
  Administered 2017-09-27: 40 mg via INTRAMUSCULAR

## 2017-09-27 MED ORDER — BUPIVACAINE HCL 0.5 % IJ SOLN
2.0000 mL | INTRAMUSCULAR | Status: AC | PRN
  Administered 2017-09-27: 2 mL

## 2017-09-27 NOTE — Progress Notes (Signed)
Office Visit Note   Patient: Tammie [name]           Date of Birth: 12/22/1950           MRN: [id number] Visit Date: 09/27/2017              Requested by: Amelia Jo, FNP [address] [place], [place] [address] PCP: Amelia Jo, FNP   Assessment & Plan: Visit Diagnoses:  1. Other secondary scoliosis, lumbar region   2. 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 Fibromyalgia   4. Degenerative lumbar disc     Plan:  #1: Steroid injection was given to the area of maximum pain which is to the right of the lumbar spine and below the iliac crest. She had some relief with the Marcaine in place. #2: We'll see her back next week and if she is not having much benefit then consider an MRI scan of the lumbar spine.  Follow-Up Instructions: Return in about 5 days (around 10/02/2017).   Orders:  Orders Placed This Encounter  Procedures  . Trigger Point Inj   No orders of the defined types were placed in this encounter.     Procedures: Trigger Point Inj Date/Time: 09/27/2017 1:40 PM Performed by: Jetty Peeks, PA-C Authorized by: Jetty Peeks, PA-C   Consent Given by:  Patient Site marked: the procedure site was marked   Timeout: prior to procedure the correct patient, procedure, and site was verified   Indications:  Pain, muscle spasm and therapeutic Total # of Trigger Points:  1 Location: back   Needle Size:  22 G Approach: Procedure posterior right midline SI area. Medications #1:  2 mL bupivacaine 0.5 %; 2 mL lidocaine 1 %; 40 mg methylPREDNISolone acetate 40 MG/ML      Clinical Data: No additional findings.   Subjective: Chief Complaint  Patient presents with  . Lower Back - Pain  . Back Pain    Right side pain x 1 month, no injury, difficulty walking, difficulty sleeping at night, no back surgery, oxycodone doesn't help    HPI  Tammie [name] is a 67 year old white female seen  today for evaluation of her right posterior pelvic pain. She was seen last by Dr. Corliss Skains on 09/24/2017. At that time she had significant severe 10 out of 10 pain in the right posterior aspect near her SI joint for 1 month. This pain was sharp and constant type pain now. She's been unable to exercise for the past month due to this pain. She does have seropositive rheumatoid arthritis as well as osteoarthritis and degenerative disc disease with fibromyalgia. She does take Plaquenil. She is also had bilateral total hip replacements. She does have some elevation of third Morehead in the acetabulum consistent with the superior pole where. He was limping.  At her visit with Dr. Corliss Skains an injection into the SI joint using a 27-gauge 1.5 inch needle through a posterior approach she was given. She states there was no benefit with that. She returns today for follow-up evaluation of her hip as well as her pain in the right SI area.   Review of Systems  Constitutional: Positive for activity change.  HENT: Negative for trouble swallowing.   Eyes: Negative for pain.  Respiratory: Positive for shortness of breath.   Cardiovascular: Negative for leg swelling.  Gastrointestinal: Positive for constipation.  Endocrine: Negative for cold intolerance.  Genitourinary: Negative for difficulty urinating.  Musculoskeletal:  Positive for back pain, gait problem, joint swelling, neck pain and neck stiffness.  Skin: Negative for rash.  Allergic/Immunologic: Negative for food allergies.  Neurological: Positive for weakness.  Hematological: Bruises/bleeds easily.  Psychiatric/Behavioral: Positive for sleep disturbance.     Objective: Vital Signs: BP (!) 176/80 (BP Location: Left Arm, Patient Position: Sitting, Cuff Size: Normal)   Pulse 81   Resp 18   Ht 5\' 6"  (1.676 m)   Wt 157 lb (71.2 kg)   BMI 25.34 kg/m   Physical Exam  Constitutional: She is oriented to person, place, and time. She appears  well-developed and well-nourished.  HENT:  Head: Normocephalic and atraumatic.  Eyes: EOM are normal. Pupils are equal, round, and reactive to light.  Pulmonary/Chest: Effort normal.  Neurological: She is alert and oriented to person, place, and time.  Skin: Skin is warm and dry.  Psychiatric: She has a normal mood and affect. Her behavior is normal. Judgment and thought content normal.    Ortho Exam  Today she walks with an antalgic limp. She has tenderness to palpation over the right lower lumbar spine. In the sitting position she has limited range of motion of her right hip which does create her problem in her back with internal and external rotation. She can get to 90. She has negative straight leg raising bilaterally. She has good strength in the sitting position. She is able to 4 flexed to about the required to 60.  Specialty Comments:  No specialty comments available.  Imaging: No results found.  X-rays that were performed yesterday by Dr. Corliss Skainseveshwar does reveal on the right hip poly-wear with superior migration of the metallic femoral head superiorly in the acetabular cup. She does have multiple cysts noted that about the right acetabulum as well as the greater trochanteric region. The left hip appears intact. Slight migration of the femoral head in the acetabulum. Cystic changes are noted also periprosthetic.  Lumbar spine reveals a degenerative scoliosis with the apex to the left. Apex at L3-4. Considerable amount of degenerative disc disease is noted. She has lateral spurring at. She has straightening of lordotic curve on the lateral. L5 reveals some compression possibly at vertebral body. There was a lot of degenerative changes at C4-5 and 5 S1 area. She foramen at that area is significantly narrowed radiographically. She does have degenerative changes in the facets.   PMFS History: Patient Active Problem List   Diagnosis Date Noted  . DDD (degenerative disc disease), lumbar  02/08/2017  . DDD (degenerative disc disease), cervical 02/08/2017  . Primary osteoarthritis of both hips 02/08/2017  . Bilateral primary osteoarthritis of knee 02/08/2017  . History of renal insufficiency 02/08/2017  . Plantar fasciitis 02/08/2017  . History of bilateral total hip arthroplasty 02/08/2017  . History of total knee replacement 02/08/2017  . History of depression 02/08/2017  . Trapezius muscle spasm 07/18/2016  . Bilateral sacroiliitis (HCC) 07/18/2016  . Rheumatoid arthritis involving multiple sites with positive rheumatoid factor (HCC) 07/17/2016  . High risk medications (not anticoagulants) long-term use 07/17/2016  . Fibromyalgia 07/17/2016  . Fatigue 07/17/2016  . Insomnia 07/17/2016   Past Medical History:  Diagnosis Date  . CRI (chronic renal insufficiency)   . DDD (degenerative disc disease), cervical   . DDD (degenerative disc disease), lumbar   . Depression   . Fibromyalgia   . OA (osteoarthritis) of knee   . Osteoarthritis of hip   . Plantar fasciitis, bilateral   . Rheumatoid arthritis (HCC)  History reviewed. No pertinent family history.  Past Surgical History:  Procedure Laterality Date  . ANKLE FRACTURE SURGERY    . APPENDECTOMY    . CESAREAN SECTION     x2  . ELBOW SURGERY    . JOINT REPLACEMENT     BIL knee   . JOINT REPLACEMENT     BIL hip  . KNEE ARTHROPLASTY    . NECK SURGERY    . REPLACEMENT TOTAL KNEE Bilateral   . TOTAL HIP ARTHROPLASTY Bilateral    Social History   Occupational History  . Not on file  Tobacco Use  . Smoking status: Never Smoker  . Smokeless tobacco: Never Used  Substance and Sexual Activity  . Alcohol use: No  . Drug use: No  . Sexual activity: Not Currently    Birth control/protection: Abstinence

## 2017-10-01 ENCOUNTER — Ambulatory Visit (INDEPENDENT_AMBULATORY_CARE_PROVIDER_SITE_OTHER): Payer: Medicare Other | Admitting: Orthopaedic Surgery

## 2017-10-01 ENCOUNTER — Encounter (INDEPENDENT_AMBULATORY_CARE_PROVIDER_SITE_OTHER): Payer: Self-pay | Admitting: Orthopaedic Surgery

## 2017-10-01 VITALS — BP 148/78 | HR 71 | Resp 14 | Ht 66.0 in | Wt 145.0 lb

## 2017-10-01 DIAGNOSIS — M25551 Pain in right hip: Secondary | ICD-10-CM

## 2017-10-01 DIAGNOSIS — M545 Low back pain: Secondary | ICD-10-CM

## 2017-10-01 DIAGNOSIS — G8929 Other chronic pain: Secondary | ICD-10-CM

## 2017-10-01 NOTE — Progress Notes (Signed)
Office Visit Note   Patient: Tammie [name]           Date of Birth: 1950/10/07           MRN: [id number] Visit Date: 10/01/2017              Requested by: Amelia Joousins, Melissa A, FNP [address] [place], [place] [id number] PCP: Amelia Joousins, Melissa A, FNP   Assessment & Plan: Visit Diagnoses:  1. Pain of right hip joint   2. Chronic right-sided low back pain without sciatica     Plan: MRI lumbar spine. Some temporary relief with that injection in the right parasacral region last week. Has another issue with wear pattern of the right total hip replacement. That will require revision of the polyethylene component.  Follow-Up Instructions: Return after MRI L-S spine.   Orders:  No orders of the defined types were placed in this encounter.  No orders of the defined types were placed in this encounter.     Procedures: No procedures performed   Clinical Data: No additional findings.   Subjective: Chief Complaint  Patient presents with  . Right Hip - Pain, Follow-up    Tammie [name] is a 67 y o here for a F/U Right side pain x 1 month, no injury, difficulty walking, difficulty sleeping at night, no back surgery, oxycodone doesn't help.    Seen last week for evaluation of severe right parasacral pain. This was injected with cortisone with almost immediate relief of her pain. Over period of day or so she had recurrence of the pain. In addition she has evidence of polyethylene wear the right total hip replacement has had some groin and anterior thigh pain. I think that separate from her back issue. The back seems to be worse than the hip. When I saw her last week she was "miserable" and using ambulatory aid. Today she came with her husband and wasn't using ambulatory aid but still having some pain in the paralumbar region. Have history of fibromyalgia. No numbness or tingling in either right or left lower extremity  HPI  Review of Systems  Constitutional: Negative for  chills, fatigue and fever.  Eyes: Negative for itching.  Respiratory: Negative for chest tightness and shortness of breath.   Cardiovascular: Negative for chest pain, palpitations and leg swelling.  Gastrointestinal: Negative for blood in stool, constipation and diarrhea.  Endocrine: Negative for polyuria.  Genitourinary: Negative for dysuria.  Musculoskeletal: Positive for back pain. Negative for joint swelling, neck pain and neck stiffness.  Allergic/Immunologic: Negative for immunocompromised state.  Neurological: Negative for dizziness and numbness.  Hematological: Does not bruise/bleed easily.  Psychiatric/Behavioral: The patient is not nervous/anxious.      Objective: Vital Signs: BP (!) 148/78   Pulse 71   Resp 14   Ht 5\' 6"  (1.676 m)   Wt 145 lb (65.8 kg)   BMI 23.40 kg/m   Physical Exam  Ortho Exam awake alert and oriented 3. Comfortable. Not using any ambulatory aid. Some pain with range of motion of her right hip with internal/external rotation that I think is referable to the wear of the total hip replacement. Straight leg raise is negative. Still having some pain in the paralumbar region on the right. Neurovascular exam intact No specialty comments available.  Imaging: No results found.   PMFS History: Patient Active Problem List   Diagnosis Date Noted  . DDD (degenerative disc disease), lumbar 02/08/2017  . DDD (degenerative disc disease), cervical 02/08/2017  .  Primary osteoarthritis of both hips 02/08/2017  . Bilateral primary osteoarthritis of knee 02/08/2017  . History of renal insufficiency 02/08/2017  . Plantar fasciitis 02/08/2017  . History of bilateral total hip arthroplasty 02/08/2017  . History of total knee replacement 02/08/2017  . History of depression 02/08/2017  . Trapezius muscle spasm 07/18/2016  . Bilateral sacroiliitis (HCC) 07/18/2016  . Rheumatoid arthritis involving multiple sites with positive rheumatoid factor (HCC) 07/17/2016  .  High risk medications (not anticoagulants) long-term use 07/17/2016  . Fibromyalgia 07/17/2016  . Fatigue 07/17/2016  . Insomnia 07/17/2016   Past Medical History:  Diagnosis Date  . CRI (chronic renal insufficiency)   . DDD (degenerative disc disease), cervical   . DDD (degenerative disc disease), lumbar   . Depression   . Fibromyalgia   . OA (osteoarthritis) of knee   . Osteoarthritis of hip   . Plantar fasciitis, bilateral   . Rheumatoid arthritis (HCC)     History reviewed. No pertinent family history.  Past Surgical History:  Procedure Laterality Date  . ANKLE FRACTURE SURGERY    . APPENDECTOMY    . CESAREAN SECTION     x2  . ELBOW SURGERY    . JOINT REPLACEMENT     BIL knee   . JOINT REPLACEMENT     BIL hip  . KNEE ARTHROPLASTY    . NECK SURGERY    . REPLACEMENT TOTAL KNEE Bilateral   . TOTAL HIP ARTHROPLASTY Bilateral    Social History   Occupational History  . Not on file  Tobacco Use  . Smoking status: Never Smoker  . Smokeless tobacco: Never Used  Substance and Sexual Activity  . Alcohol use: No  . Drug use: No  . Sexual activity: Not Currently    Birth control/protection: Abstinence

## 2017-10-14 ENCOUNTER — Ambulatory Visit
Admission: RE | Admit: 2017-10-14 | Discharge: 2017-10-14 | Disposition: A | Discharge status: Home / Self Care | Payer: Medicare Other | Source: Ambulatory Visit | Attending: Orthopaedic Surgery | Admitting: Orthopaedic Surgery

## 2017-10-14 DIAGNOSIS — G8929 Other chronic pain: Secondary | ICD-10-CM

## 2017-10-14 DIAGNOSIS — M545 Low back pain: Principal | ICD-10-CM

## 2017-10-23 ENCOUNTER — Other Ambulatory Visit: Payer: Self-pay | Admitting: Rheumatology

## 2017-10-23 ENCOUNTER — Encounter (INDEPENDENT_AMBULATORY_CARE_PROVIDER_SITE_OTHER): Payer: Self-pay | Admitting: Orthopaedic Surgery

## 2017-10-23 ENCOUNTER — Ambulatory Visit (INDEPENDENT_AMBULATORY_CARE_PROVIDER_SITE_OTHER): Payer: Medicare Other | Admitting: Orthopaedic Surgery

## 2017-10-23 DIAGNOSIS — M545 Low back pain: Secondary | ICD-10-CM | POA: Diagnosis not present

## 2017-10-23 DIAGNOSIS — G8929 Other chronic pain: Secondary | ICD-10-CM

## 2017-10-23 NOTE — Progress Notes (Signed)
Office Visit Note   Patient: Tammie [name]           Date of Birth: [birth year]           MRN: [id number] Visit Date: 10/23/2017              Requested by: Amelia Jo, FNP [address] [place], [place] [address] PCP: Amelia Jo, FNP   Assessment & Plan: Visit Diagnoses:  1. Chronic bilateral low back pain without sciatica     Plan: MRI scan demonstrates degenerative scoliosis. There is significant pathology on the left where she is asymptomatic including a left-sided disc protrusion at L1-2 and L2-3 that's unchanged from her prior studyn 2011. There was moderate subarticular stenosis on the left at L4-5 and mild spinal stenosis at were also unchanged. There was severe left foraminal encroachment at L5-S1 due to spurring and disc protrusion and impingement of the left at L5-S1 similar to the prior study.majority of her pain appears to be on the left with little referred discomfort. She does have some discomfort in her right hip which appears to be poly-wear from her hip replacement. I tried a lumbar support and a made a big difference. She is being treated in a pain clinic for any additional medicines but the back support really help. I've also urged her to perform back exercises while she wears the back support and have given her a set of exercises. They prefer to do this rather than go to physical therapy.t this point I don't see any reason to consider revision of her right hip prosthesis and specifically the polyethylene liner as it is not creating much pain. We'll plan to see her back as needed  Follow-Up Instructions: Return if symptoms worsen or fail to improve.   Orders:  No orders of the defined types were placed in this encounter.  No orders of the defined types were placed in this encounter.     Procedures: No procedures performed   Clinical Data: No additional findings.   Subjective: No chief complaint on file. recently evaluatedwith pain in  her back right buttock ad right groin. There is evidence of polyethylene wear of her right total hip replacement but her pain was more referred from her lumbar spine and thus the reason for obtaining an MRI scan.  HPI  Review of Systems   Objective: Vital Signs: There were no vitals taken for this visit.  Physical Exam  Ortho Examwalks without a limp. Minimal pain with internal/external rotation of her right hip. Straight leg raise negative. Does have some areas of percussible tenderness about the lumbar spine and the right buttock. Skin intact. No pain along the lateral aspect of her hip. Neurovascular exam intact  Specialty Comments:  No specialty comments available.  Imaging: No results found.   PMFS History: Patient Active Problem List   Diagnosis Date Noted  . DDD (degenerative disc disease), lumbar 02/08/2017  . DDD (degenerative disc disease), cervical 02/08/2017  . Primary osteoarthritis of both hips 02/08/2017  . Bilateral primary osteoarthritis of knee 02/08/2017  . History of renal insufficiency 02/08/2017  . Plantar fasciitis 02/08/2017  . History of bilateral total hip arthroplasty 02/08/2017  . History of total knee replacement 02/08/2017  . History of depression 02/08/2017  . Trapezius muscle spasm 07/18/2016  . Bilateral sacroiliitis (HCC) 07/18/2016  . Rheumatoid arthritis involving multiple sites with positive rheumatoid factor (HCC) 07/17/2016  . High risk medications (not anticoagulants) long-term use 07/17/2016  .  Fibromyalgia 07/17/2016  . Fatigue 07/17/2016  . Insomnia 07/17/2016   Past Medical History:  Diagnosis Date  . CRI (chronic renal insufficiency)   . DDD (degenerative disc disease), cervical   . DDD (degenerative disc disease), lumbar   . Depression   . Fibromyalgia   . OA (osteoarthritis) of knee   . Osteoarthritis of hip   . Plantar fasciitis, bilateral   . Rheumatoid arthritis (HCC)     History reviewed. No pertinent family  history.  Past Surgical History:  Procedure Laterality Date  . ANKLE FRACTURE SURGERY    . APPENDECTOMY    . CESAREAN SECTION     x2  . ELBOW SURGERY    . JOINT REPLACEMENT     BIL knee   . JOINT REPLACEMENT     BIL hip  . KNEE ARTHROPLASTY    . NECK SURGERY    . REPLACEMENT TOTAL KNEE Bilateral   . TOTAL HIP ARTHROPLASTY Bilateral    Social History   Occupational History  . Not on file  Tobacco Use  . Smoking status: Never Smoker  . Smokeless tobacco: Never Used  Substance and Sexual Activity  . Alcohol use: No  . Drug use: No  . Sexual activity: Not Currently    Birth control/protection: Abstinence     Valeria BatmanPeter W Whitfield, MD   Note - This record has been created using [employer] software.  Chart creation errors have been sought, but may not always  have been located. Such creation errors do not reflect on  the standard of medical care.

## 2017-10-23 NOTE — Patient Instructions (Signed)

## 2017-10-24 NOTE — Telephone Encounter (Addendum)
Last Visit: 09/24/17 Next Visit: 02/05/18 Labs: 09/24/17 stable PLQ Eye Exam: 06/28/16 WNL  Left message for patient to advise we need her PLQ eye exam.   Okay to refill 30 day supply per Dr. Corliss Skainseveshwar

## 2017-10-29 ENCOUNTER — Telehealth: Payer: Self-pay | Admitting: Rheumatology

## 2017-10-29 NOTE — Telephone Encounter (Signed)
[facility] calling for verbal authorization for Ambien 10mg  tabs. [phone]. Ref # [id number]

## 2017-10-29 NOTE — Telephone Encounter (Signed)
Called [facility] about Ambien refill and denied it. Patient is not eligible for a refill until the end of the month and patient has been getting Ambien from [facility]. [facility] states they wouldn't be able to refill the Ambien until April 2019 anyways.

## 2017-12-06 ENCOUNTER — Other Ambulatory Visit: Payer: Self-pay | Admitting: Rheumatology

## 2017-12-06 NOTE — Telephone Encounter (Signed)
Last Visit: 09/24/17 Next Visit: 02/05/18 Labs: 09/24/17 stable PLQ Eye Exam:11/16/17 WNL  Okay to refill per Dr. Deveshwar  

## 2017-12-10 ENCOUNTER — Other Ambulatory Visit: Payer: Self-pay | Admitting: *Deleted

## 2017-12-10 MED ORDER — ZOLPIDEM TARTRATE 10 MG PO TABS: 10.0000 mg | ORAL_TABLET | Freq: Every day | ORAL | 0 refills | Status: DC

## 2017-12-10 NOTE — Telephone Encounter (Addendum)
Refill request received via fax  Last Visit: 09/24/17 Next Visit: 02/05/18  Okay to refill Ambien?

## 2018-01-04 ENCOUNTER — Other Ambulatory Visit: Payer: Self-pay | Admitting: Rheumatology

## 2018-01-04 NOTE — Telephone Encounter (Signed)
Last Visit: 09/24/17 Next Visit: 02/05/18 Labs: 09/24/17 stable PLQ Eye Exam:11/16/17 WNL  Okay to refill per Dr. Corliss Skainseveshwar

## 2018-01-22 NOTE — Progress Notes (Signed)
Office Visit Note  Patient: Tammie [name]             Date of Birth: 1951-06-17           MRN: [id number]             PCP: Amelia Jo, FNP Referring: Amelia Jo, FNP Visit Date: 02/05/2018 Occupation: @    Subjective:  Lower back pain    History of Present Illness: Tammie [name] is a 67 y.o. female with history of seropositive rheumatoid arthritis, DDD, and fibromyalgia.  Patient states she continues to have generalized muscle tenderness and muscle aches due to fibromyalgia.  She is also having tension in the trapezius region.  She also has chronic pain in her neck and lower back.  She states she has been seeing a spine specialist and had epidural injection which provided relief for 1 to 2 weeks in her lower back.  She states she is considering having radiofrequency ablation in the future.  She states that she has has significant pain when laying flat at night.  She reports that overall her bilateral knees replacements as well as bilateral hip replacements are doing well.  She reports that she has limited range of motion of bilateral hips.  She states that she has been going to a pain clinic for management of her pain.  She continues to having increased fatigue as well as insomnia.  She has been taking Ambien 10 mg at bedtime.  Activities of Daily Living:  Patient reports morning stiffness for  1-2 hours.   Patient Reports nocturnal pain.  Difficulty dressing/grooming: Denies Difficulty climbing stairs: Reports Difficulty getting out of chair: Reports Difficulty using hands for taps, buttons, cutlery, and/or writing: Reports   Review of Systems  Constitutional: Positive for fatigue.  HENT: Positive for mouth dryness. Negative for mouth sores and nose dryness.   Eyes: Negative for pain, visual disturbance and dryness.  Respiratory: Negative for cough, hemoptysis, shortness of breath and difficulty breathing.   Cardiovascular: Negative for chest  pain, palpitations, hypertension and swelling in legs/feet.  Gastrointestinal: Positive for constipation. Negative for blood in stool and diarrhea.  Endocrine: Negative for increased urination.  Genitourinary: Negative for painful urination.  Musculoskeletal: Positive for arthralgias, joint pain, myalgias, morning stiffness, muscle tenderness and myalgias. Negative for joint swelling and muscle weakness.  Skin: Negative for color change, pallor, rash, hair loss, nodules/bumps, skin tightness, ulcers and sensitivity to sunlight.  Allergic/Immunologic: Negative for susceptible to infections.  Neurological: Negative for dizziness, numbness, headaches and weakness.  Hematological: Negative for swollen glands.  Psychiatric/Behavioral: Positive for sleep disturbance. Negative for depressed mood. The patient is not nervous/anxious.     PMFS History:  Patient Active Problem List   Diagnosis Date Noted  . DDD (degenerative disc disease), lumbar 02/08/2017  . DDD (degenerative disc disease), cervical 02/08/2017  . Primary osteoarthritis of both hips 02/08/2017  . Bilateral primary osteoarthritis of knee 02/08/2017  . History of renal insufficiency 02/08/2017  . Plantar fasciitis 02/08/2017  . History of bilateral total hip arthroplasty 02/08/2017  . History of total knee replacement 02/08/2017  . History of depression 02/08/2017  . Trapezius muscle spasm 07/18/2016  . Bilateral sacroiliitis (HCC) 07/18/2016  . Rheumatoid arthritis involving multiple sites with positive rheumatoid factor (HCC) 07/17/2016  . High risk medications (not anticoagulants) long-term use 07/17/2016  . Fibromyalgia 07/17/2016  . Fatigue 07/17/2016  . Insomnia 07/17/2016    Past Medical History:  Diagnosis Date  . CRI (  chronic renal insufficiency)   . DDD (degenerative disc disease), cervical   . DDD (degenerative disc disease), lumbar   . Depression   . Fibromyalgia   . OA (osteoarthritis) of knee   .  Osteoarthritis of hip   . Plantar fasciitis, bilateral   . Rheumatoid arthritis (HCC)     History reviewed. No pertinent family history. Past Surgical History:  Procedure Laterality Date  . ANKLE FRACTURE SURGERY    . APPENDECTOMY    . CESAREAN SECTION     x2  . ELBOW SURGERY    . JOINT REPLACEMENT     BIL knee   . JOINT REPLACEMENT     BIL hip  . KNEE ARTHROPLASTY    . NECK SURGERY    . REPLACEMENT TOTAL KNEE Bilateral   . TOTAL HIP ARTHROPLASTY Bilateral    Social History   Social History Narrative  . Not on file     Objective: Vital Signs: BP (!) 154/81 (BP Location: Left Arm, Patient Position: Sitting, Cuff Size: Normal)   Pulse (!) 59   Resp 12   Ht  (1.676 m)   Wt 158 lb 8 oz (71.9 kg)   BMI 25.58 kg/m    Physical Exam  Constitutional: She is oriented to person, place, and time. She appears well-developed and well-nourished.  HENT:  Head: Normocephalic and atraumatic.  Eyes: Conjunctivae and EOM are normal.  Neck: Normal range of motion.  Cardiovascular: Normal rate, regular rhythm, normal heart sounds and intact distal pulses.  Pulmonary/Chest: Effort normal and breath sounds normal.  Abdominal: Soft. Bowel sounds are normal.  Lymphadenopathy:    She has no cervical adenopathy.  Neurological: She is alert and oriented to person, place, and time.  Skin: Skin is warm and dry. Capillary refill takes less than 2 seconds.  Psychiatric: She has a normal mood and affect. Her behavior is normal.  Nursing note and vitals reviewed.    Musculoskeletal Exam: C-spine limited ROM with discomfort. Thoracic and lumbar spine limited ROM.  Trapezius muscle tenderness. Shoulder joints, elbow joints, wrist joints, MCPs, PIPs, and DIPs good ROM with no synovitis. PIP and DIP synovial thickening consistent with osteoarthritis.  Limited ROM of bilateral hip joints. Knee joints, ankle joints, MTPs, PIPs, and DIPs good ROM with no synovitis.  Slight warmth but no effusion of  knee joints.  Right ankle fusion.   CDAI Exam: No CDAI exam completed.    Investigation: No additional findings.PLQ eye exam: 11/16/2017 CBC Latest Ref Rng & Units 09/24/2017 02/16/2017 10/27/2007  WBC 3.8 - 10.8 Thousand/uL 6.1 5.6 5.6  Hemoglobin 11.7 - 15.5 g/dL 16.1 09.6 9.0(L)  Hematocrit 35.0 - 45.0 % 38.9 39.0 26.6(L)  Platelets 140 - 400 Thousand/uL 339 319 192   CMP Latest Ref Rng & Units 09/24/2017 02/16/2017 10/27/2007  Glucose 65 - 99 mg/dL 95 85 83  BUN 7 - 25 mg/dL Creatinine 0.50 - 0.99 mg/dL 0.45(W) 0.98(J) 1.91  Sodium 135 - 146 mmol/L 140 139 137  Potassium 3.5 - 5.3 mmol/L 4.5 4.4 4.1  Chloride 98 - 110 mmol/L 107 104 106  CO2 20 - 32 mmol/L Calcium 8.6 - 10.4 mg/dL 9.1 9.0 8.5  Total Protein 6.1 - 8.1 g/dL 6.5 6.3 -  Total Bilirubin 0.2 - 1.2 mg/dL 0.3 0.3 -  Alkaline Phos 33 - 130 U/L - 98 -  AST 10 - 35 U/L 16 18 -  ALT 6 - 29 U/L 10  9 -    Imaging: No results found.  Speciality Comments: PLQ Eye Exam: 11/16/17 WNL @ [facility] Follow up in 1 year    Procedures:  No procedures performed Allergies: Patient has no known allergies.   Assessment / Plan:     Visit Diagnoses: Rheumatoid arthritis involving multiple sites with positive rheumatoid factor (HCC): She has no active synovitis.  She has not had any recent rheumatoid arthritis flares.  She has no pain in her hands or feet at this time.  She is clinically doing well on Plaquenil.  She will continue taking Plaquenil 1 tablet by mouth twice daily Monday through Friday.  High risk medications (not anticoagulants) long-term use - PLQ.eye exam: 11/16/2017.  CBC and CMP will be drawn today to monitor for drug toxicity.- Plan: CBC with Differential/Platelet, COMPLETE METABOLIC PANEL WITH GFR  Fibromyalgia: She continues to have chronic generalized pain due to fibromyalgia.  She has muscle aches and muscle tenderness.  She also is having trapezius muscle tension.  She continues to have  chronic insomnia and fatigue.  She has been taking Ambien 10 mg at bedtime.  She has been seeing a pain management specialist to manage her pain.  She was encouraged to try to start exercising on a regular basis and we discussed good sleep hygiene.  Primary insomnia - Ambien 10 mg at bedtime.  She understands the risks of being on Ambien 10 mg at bedtime.  Her next refill we will send in Ambien 5 mg at bedtime.  Other fatigue: Chronic and related to insomnia.  DDD (degenerative disc disease), cervical: She is limited range of motion with discomfort in her C-spine.  DDD (degenerative disc disease), lumbar: Chronic pain.  She is seeing a spine specialist.  She has had an epidural injection which provided relief for 1 to 2 weeks.  She is considering having a radiofrequency ablation in the future.  History of total bilateral knee replacement: Doing well.  She has mild warmth of bilateral knees but no effusion.  History of bilateral total hip arthroplasty - Bilateral hip replacements performed by Dr. Cleophas Dunker.  She has no hip pain at this time.  She has limited range of motion of bilateral hips.  Plantar fasciitis: Resolved.   History of renal insufficiency  History of depression    Orders: Orders Placed This Encounter  Procedures  . CBC with Differential/Platelet  . COMPLETE METABOLIC PANEL WITH GFR   No orders of the defined types were placed in this encounter.  Follow-Up Instructions: Return in about 5 months (around 07/08/2018) for Rheumatoid arthritis, Fibromyalgia.   Gearldine Bienenstock, PA-C   I examined and evaluated the patient with Sherron Ales PA.  She had no synovitis on my examination.  The plan of care was discussed as noted above.  Pollyann Savoy, MD  Note - This record has been created using Animal nutritionist.  Chart creation errors have been sought, but may not always  have been located. Such creation errors do not reflect on  the standard of medical care.

## 2018-02-05 ENCOUNTER — Ambulatory Visit: Payer: Medicare Other | Admitting: Rheumatology

## 2018-02-05 ENCOUNTER — Encounter: Payer: Self-pay | Admitting: Rheumatology

## 2018-02-05 VITALS — BP 154/81 | HR 59 | Resp 12 | Ht 66.0 in | Wt 158.5 lb

## 2018-02-05 DIAGNOSIS — Z8659 Personal history of other mental and behavioral disorders: Secondary | ICD-10-CM

## 2018-02-05 DIAGNOSIS — Z96643 Presence of artificial hip joint, bilateral: Secondary | ICD-10-CM | POA: Diagnosis not present

## 2018-02-05 DIAGNOSIS — M797 Fibromyalgia: Secondary | ICD-10-CM | POA: Diagnosis not present

## 2018-02-05 DIAGNOSIS — R5383 Other fatigue: Secondary | ICD-10-CM | POA: Diagnosis not present

## 2018-02-05 DIAGNOSIS — M5030 Other cervical disc degeneration, unspecified cervical region: Secondary | ICD-10-CM | POA: Diagnosis not present

## 2018-02-05 DIAGNOSIS — M0579 Rheumatoid arthritis with rheumatoid factor of multiple sites without organ or systems involvement: Secondary | ICD-10-CM | POA: Diagnosis not present

## 2018-02-05 DIAGNOSIS — Z96653 Presence of artificial knee joint, bilateral: Secondary | ICD-10-CM | POA: Diagnosis not present

## 2018-02-05 DIAGNOSIS — F5101 Primary insomnia: Secondary | ICD-10-CM | POA: Diagnosis not present

## 2018-02-05 DIAGNOSIS — Z87448 Personal history of other diseases of urinary system: Secondary | ICD-10-CM

## 2018-02-05 DIAGNOSIS — Z79899 Other long term (current) drug therapy: Secondary | ICD-10-CM

## 2018-02-05 DIAGNOSIS — M722 Plantar fascial fibromatosis: Secondary | ICD-10-CM

## 2018-02-05 DIAGNOSIS — M5136 Other intervertebral disc degeneration, lumbar region: Secondary | ICD-10-CM | POA: Diagnosis not present

## 2018-02-05 LAB — CBC WITH DIFFERENTIAL/PLATELET
BASOS PCT: 0.7 %
Basophils Absolute: 31 cells/uL (ref 0–200)
Eosinophils Absolute: 110 cells/uL (ref 15–500)
Eosinophils Relative: 2.5 %
HCT: 35.8 % (ref 35.0–45.0)
HEMOGLOBIN: 12.1 g/dL (ref 11.7–15.5)
Lymphs Abs: 1166 cells/uL (ref 850–3900)
MCH: 31.5 pg (ref 27.0–33.0)
MCHC: 33.8 g/dL (ref 32.0–36.0)
MCV: 93.2 fL (ref 80.0–100.0)
MONOS PCT: 9.7 %
MPV: 9.6 fL (ref 7.5–12.5)
NEUTROS ABS: 2666 {cells}/uL (ref 1500–7800)
Neutrophils Relative %: 60.6 %
PLATELETS: 309 10*3/uL (ref 140–400)
RBC: 3.84 10*6/uL (ref 3.80–5.10)
RDW: 12.4 % (ref 11.0–15.0)
TOTAL LYMPHOCYTE: 26.5 %
WBC: 4.4 10*3/uL (ref 3.8–10.8)
WBCMIX: 427 {cells}/uL (ref 200–950)

## 2018-02-05 LAB — COMPLETE METABOLIC PANEL WITH GFR
AG Ratio: 1.7 (calc) (ref 1.0–2.5)
ALT: 11 U/L (ref 6–29)
AST: 19 U/L (ref 10–35)
Albumin: 4 g/dL (ref 3.6–5.1)
Alkaline phosphatase (APISO): 70 U/L (ref 33–130)
BUN/Creatinine Ratio: 15 (calc) (ref 6–22)
BUN: 15 mg/dL (ref 7–25)
CALCIUM: 9.4 mg/dL (ref 8.6–10.4)
CHLORIDE: 106 mmol/L (ref 98–110)
CO2: 28 mmol/L (ref 20–32)
CREATININE: 1 mg/dL — AB (ref 0.50–0.99)
GFR, EST AFRICAN AMERICAN: 68 mL/min/{1.73_m2} (ref >=60)
GFR, Est Non African American: 59 mL/min/{1.73_m2} — ABNORMAL LOW (ref >=60)
Globulin: 2.3 g/dL (calc) (ref 1.9–3.7)
Glucose, Bld: 87 mg/dL (ref 65–99)
Potassium: 4.6 mmol/L (ref 3.5–5.3)
Sodium: 142 mmol/L (ref 135–146)
TOTAL PROTEIN: 6.3 g/dL (ref 6.1–8.1)
Total Bilirubin: 0.3 mg/dL (ref 0.2–1.2)

## 2018-02-06 NOTE — Progress Notes (Signed)
Creatinine trending down. GFR improving.  All other labs are WNL.

## 2018-02-21 ENCOUNTER — Other Ambulatory Visit: Payer: Self-pay | Admitting: Rheumatology

## 2018-02-21 ENCOUNTER — Other Ambulatory Visit: Payer: Self-pay | Admitting: Physician Assistant

## 2018-02-21 NOTE — Telephone Encounter (Signed)
Last Visit: 02/05/18 Next visit: 07/11/18 Labs: 02/05/18 Creatinine trending down. GFR improving. All other labs are WNL. PLQ Eye Exam: 11/16/17 WNL  Okay to refill per Dr. Deveshwar 

## 2018-05-18 ENCOUNTER — Other Ambulatory Visit: Payer: Self-pay | Admitting: Rheumatology

## 2018-05-20 NOTE — Telephone Encounter (Signed)
Last Visit: 02/05/18 Next visit: 07/11/18 Labs: 02/05/18 Creatinine trending down. GFR improving. All other labs are WNL. PLQ Eye Exam: 11/16/17 WNL  Okay to refill per Dr. Corliss Skainseveshwar

## 2018-06-10 NOTE — Progress Notes (Signed)
Office Visit Note  Patient: Tammie [name]             Date of Birth: Sep 25, 1951           MRN: [id number]             PCP: Amelia Jo, FNP Referring: Amelia Jo, FNP Visit Date: 06/11/2018 Occupation: @GUAROCC @  Subjective:  Lower back pain   History of Present Illness: Tammie [name] is a 67 y.o. female with history of seropositive rheumatoid arthritis fibromyalgia and osteoarthritis.  She also have degenerative disc disease of cervical and lumbar spine.  She states she was doing quite well prior to her visit to [place].  She just came back from [place] a week ago.  She states few days prior to coming from there she started having increased joint pain and discomfort.  Had epidural injection to her lumbar spine yesterday due to increased lower back pain.  She states the pain is worse and radiating into her right lower extremity.  Activities of Daily Living:  Patient reports morning stiffness for 1-2 hours.   Patient Reports nocturnal pain.  Difficulty dressing/grooming: Reports Difficulty climbing stairs: Reports Difficulty getting out of chair: Reports Difficulty using hands for taps, buttons, cutlery, and/or writing: Reports  Review of Systems  Constitutional: Negative for fatigue, night sweats, weight gain and weight loss.  HENT: Negative for mouth sores, trouble swallowing, trouble swallowing, mouth dryness and nose dryness.   Eyes: Negative for pain, redness, visual disturbance and dryness.  Respiratory: Negative for cough, shortness of breath and difficulty breathing.   Cardiovascular: Negative for chest pain, palpitations, hypertension, irregular heartbeat and swelling in legs/feet.  Gastrointestinal: Negative for blood in stool, constipation and diarrhea.  Endocrine: Negative for increased urination.  Genitourinary: Negative for difficulty urinating and vaginal dryness.  Musculoskeletal: Positive for arthralgias, joint pain, myalgias, morning  stiffness and myalgias. Negative for joint swelling, muscle weakness and muscle tenderness.  Skin: Positive for hair loss and sensitivity to sunlight. Negative for color change, rash, skin tightness and ulcers.  Allergic/Immunologic: Negative for susceptible to infections.  Neurological: Positive for numbness and weakness. Negative for dizziness, headaches, memory loss and night sweats.  Hematological: Negative for swollen glands.  Psychiatric/Behavioral: Positive for sleep disturbance. Negative for depressed mood. The patient is nervous/anxious.     PMFS History:  Patient Active Problem List   Diagnosis Date Noted  . DDD (degenerative disc disease), lumbar 02/08/2017  . DDD (degenerative disc disease), cervical 02/08/2017  . Primary osteoarthritis of both hips 02/08/2017  . Bilateral primary osteoarthritis of knee 02/08/2017  . History of renal insufficiency 02/08/2017  . Plantar fasciitis 02/08/2017  . History of bilateral total hip arthroplasty 02/08/2017  . History of total knee replacement 02/08/2017  . History of depression 02/08/2017  . Trapezius muscle spasm 07/18/2016  . Bilateral sacroiliitis (HCC) 07/18/2016  . Rheumatoid arthritis involving multiple sites with positive rheumatoid factor (HCC) 07/17/2016  . High risk medications (not anticoagulants) long-term use 07/17/2016  . Fibromyalgia 07/17/2016  . Fatigue 07/17/2016  . Insomnia 07/17/2016    Past Medical History:  Diagnosis Date  . CRI (chronic renal insufficiency)   . DDD (degenerative disc disease), cervical   . DDD (degenerative disc disease), lumbar   . Depression   . Fibromyalgia   . OA (osteoarthritis) of knee   . Osteoarthritis of hip   . Plantar fasciitis, bilateral   . Rheumatoid arthritis (HCC)     History reviewed. No pertinent family history.  Past Surgical History:  Procedure Laterality Date  . ANKLE FRACTURE SURGERY    . APPENDECTOMY    . CESAREAN SECTION     x2  . ELBOW SURGERY    . JOINT  REPLACEMENT     BIL knee   . JOINT REPLACEMENT     BIL hip  . KNEE ARTHROPLASTY    . NECK SURGERY    . REPLACEMENT TOTAL KNEE Bilateral   . TOTAL HIP ARTHROPLASTY Bilateral    Social History   Social History Narrative  . Not on file    Objective: Vital Signs: BP (!) 143/78 (BP Location: Right Arm, Patient Position: Sitting, Cuff Size: Normal)   Pulse 73   Ht 5\' 5"  (1.651 m)   Wt 158 lb (71.7 kg)   BMI 26.29 kg/m    Physical Exam  Constitutional: She is oriented to person, place, and time. She appears well-developed and well-nourished.  HENT:  Head: Normocephalic and atraumatic.  Eyes: Conjunctivae and EOM are normal.  Neck: Normal range of motion.  Cardiovascular: Normal rate, regular rhythm, normal heart sounds and intact distal pulses.  Pulmonary/Chest: Effort normal and breath sounds normal.  Abdominal: Soft. Bowel sounds are normal.  Lymphadenopathy:    She has no cervical adenopathy.  Neurological: She is alert and oriented to person, place, and time.  Skin: Skin is warm and dry. Capillary refill takes less than 2 seconds.  Psychiatric: She has a normal mood and affect. Her behavior is normal.  Nursing note and vitals reviewed.    Musculoskeletal Exam: C-spine limited range of motion.  Thoracic and lumbar spine limited range of motion.  Shoulder joints elbow joints wrist joint MCPs PIPs DIPs were in good range of motion with no synovitis.  She has limited range of motion of bilateral hip joints with some discomfort.  Both hip joints are replaced.  She has bilateral total knee replacement which appears to be doing well.  She had no synovitis on examination today.  CDAI Exam: CDAI Score: 0.4  Patient Global Assessment: 2 (mm); Provider Global Assessment: 2 (mm) Swollen: 0 ; Tender: 0  Joint Exam   Not documented   There is currently no information documented on the homunculus. Go to the Rheumatology activity and complete the homunculus joint  exam.  Investigation: No additional findings.  Imaging: No results found.  Recent Labs: Lab Results  Component Value Date   WBC 4.4 02/05/2018   HGB 12.1 02/05/2018   PLT 309 02/05/2018   NA 142 02/05/2018   K 4.6 02/05/2018   CL 106 02/05/2018   CO2 28 02/05/2018   GLUCOSE 87 02/05/2018   BUN 15 02/05/2018   CREATININE 1.00 (H) 02/05/2018   BILITOT 0.3 02/05/2018   ALKPHOS 98 02/16/2017   AST 19 02/05/2018   ALT 11 02/05/2018   PROT 6.3 02/05/2018   ALBUMIN 3.8 02/16/2017   CALCIUM 9.4 02/05/2018   GFRAA 68 02/05/2018    Speciality Comments: PLQ Eye Exam: 11/16/17 WNL @ [facility] Follow up in 1 year  Procedures:  No procedures performed Allergies: Patient has no known allergies.   Assessment / Plan:     Visit Diagnoses: Rheumatoid arthritis involving multiple sites with positive rheumatoid factor (HCC)-patient had no synovitis on examination.  She continues to have some discomfort.  High risk medications (not anticoagulants) long-term use - PLQ. eye exam: 11/16/2017 - Plan: CBC with Differential/Platelet, COMPLETE METABOLIC PANEL WITH GFR today and then every 5 months.  Fibromyalgia-she continues to  have some generalized pain and discomfort.  She is going to pain management.  Other fatigue-she has fatigue secondary to fibromyalgia.  Primary insomnia - ambien10 milligrams p.o. nightly as needed for insomnia.  DDD (degenerative disc disease), cervical-she has limited range of motion with discomfort.  DDD (degenerative disc disease), lumbar - She is seeing a spine specialist.  She recently had epidural injection without much relief.  She had complaints of right-sided radiculopathy.  History of bilateral total hip arthroplasty - Bilateral hip replacements performed by Dr. Cleophas DunkerWhitfield.  She has limited range of motion with some discomfort.  History of total bilateral knee replacement-chronic pain.  History of depression-she has situational depression due to  generalized pain and discomfort.  History of renal insufficiency   Orders: Orders Placed This Encounter  Procedures  . CBC with Differential/Platelet  . COMPLETE METABOLIC PANEL WITH GFR   No orders of the defined types were placed in this encounter.   Face-to-face time spent with patient was 30 minutes. Greater than 50% of time was spent in counseling and coordination of care.  Follow-Up Instructions: Return in about 5 months (around 11/11/2018) for Rheumatoid arthritis, Osteoarthritis,FMS.   Pollyann SavoyShaili Cypress Fanfan, MD  Note - This record has been created using Animal nutritionistDragon software.  Chart creation errors have been sought, but may not always  have been located. Such creation errors do not reflect on  the standard of medical care.

## 2018-06-11 ENCOUNTER — Encounter: Payer: Self-pay | Admitting: Rheumatology

## 2018-06-11 ENCOUNTER — Ambulatory Visit: Payer: Medicare Other | Admitting: Rheumatology

## 2018-06-11 VITALS — BP 143/78 | HR 73 | Ht 65.0 in | Wt 158.0 lb

## 2018-06-11 DIAGNOSIS — Z96643 Presence of artificial hip joint, bilateral: Secondary | ICD-10-CM

## 2018-06-11 DIAGNOSIS — M51369 Other intervertebral disc degeneration, lumbar region without mention of lumbar back pain or lower extremity pain: Secondary | ICD-10-CM

## 2018-06-11 DIAGNOSIS — Z8659 Personal history of other mental and behavioral disorders: Secondary | ICD-10-CM

## 2018-06-11 DIAGNOSIS — M5136 Other intervertebral disc degeneration, lumbar region: Secondary | ICD-10-CM

## 2018-06-11 DIAGNOSIS — M797 Fibromyalgia: Secondary | ICD-10-CM | POA: Diagnosis not present

## 2018-06-11 DIAGNOSIS — M0579 Rheumatoid arthritis with rheumatoid factor of multiple sites without organ or systems involvement: Secondary | ICD-10-CM

## 2018-06-11 DIAGNOSIS — Z87448 Personal history of other diseases of urinary system: Secondary | ICD-10-CM

## 2018-06-11 DIAGNOSIS — Z96653 Presence of artificial knee joint, bilateral: Secondary | ICD-10-CM

## 2018-06-11 DIAGNOSIS — F5101 Primary insomnia: Secondary | ICD-10-CM

## 2018-06-11 DIAGNOSIS — Z79899 Other long term (current) drug therapy: Secondary | ICD-10-CM

## 2018-06-11 DIAGNOSIS — R5383 Other fatigue: Secondary | ICD-10-CM | POA: Diagnosis not present

## 2018-06-11 DIAGNOSIS — M5030 Other cervical disc degeneration, unspecified cervical region: Secondary | ICD-10-CM

## 2018-06-11 LAB — COMPLETE METABOLIC PANEL WITH GFR
AG RATIO: 1.9 (calc) (ref 1.0–2.5)
ALBUMIN MSPROF: 4.3 g/dL (ref 3.6–5.1)
ALT: 11 U/L (ref 6–29)
AST: 20 U/L (ref 10–35)
Alkaline phosphatase (APISO): 67 U/L (ref 33–130)
BUN/Creatinine Ratio: 22 (calc) (ref 6–22)
BUN: 23 mg/dL (ref 7–25)
CALCIUM: 9.3 mg/dL (ref 8.6–10.4)
CHLORIDE: 103 mmol/L (ref 98–110)
CO2: 29 mmol/L (ref 20–32)
Creat: 1.05 mg/dL — ABNORMAL HIGH (ref 0.50–0.99)
GFR, EST AFRICAN AMERICAN: 64 mL/min/{1.73_m2} (ref >=60)
GFR, EST NON AFRICAN AMERICAN: 55 mL/min/{1.73_m2} — AB (ref >=60)
GLOBULIN: 2.3 g/dL (ref 1.9–3.7)
Glucose, Bld: 86 mg/dL (ref 65–99)
POTASSIUM: 4.5 mmol/L (ref 3.5–5.3)
SODIUM: 139 mmol/L (ref 135–146)
TOTAL PROTEIN: 6.6 g/dL (ref 6.1–8.1)
Total Bilirubin: 0.3 mg/dL (ref 0.2–1.2)

## 2018-06-11 LAB — CBC WITH DIFFERENTIAL/PLATELET
BASOS PCT: 0.4 %
Basophils Absolute: 19 cells/uL (ref 0–200)
EOS ABS: 82 {cells}/uL (ref 15–500)
Eosinophils Relative: 1.7 %
HEMATOCRIT: 39.3 % (ref 35.0–45.0)
HEMOGLOBIN: 13 g/dL (ref 11.7–15.5)
LYMPHS ABS: 1435 {cells}/uL (ref 850–3900)
MCH: 30.7 pg (ref 27.0–33.0)
MCHC: 33.1 g/dL (ref 32.0–36.0)
MCV: 92.7 fL (ref 80.0–100.0)
MPV: 9.6 fL (ref 7.5–12.5)
Monocytes Relative: 10.1 %
Neutro Abs: 2779 cells/uL (ref 1500–7800)
Neutrophils Relative %: 57.9 %
Platelets: 281 10*3/uL (ref 140–400)
RBC: 4.24 10*6/uL (ref 3.80–5.10)
RDW: 12.8 % (ref 11.0–15.0)
Total Lymphocyte: 29.9 %
WBC: 4.8 10*3/uL (ref 3.8–10.8)
WBCMIX: 485 {cells}/uL (ref 200–950)

## 2018-06-17 ENCOUNTER — Other Ambulatory Visit: Payer: Self-pay | Admitting: Physician Assistant

## 2018-06-17 NOTE — Telephone Encounter (Signed)
Last Visit: 06/11/18 Next Visit: 11/11/18  Okay to refill Ambien?

## 2018-07-11 ENCOUNTER — Ambulatory Visit: Payer: Medicare Other | Admitting: Rheumatology

## 2018-09-03 ENCOUNTER — Other Ambulatory Visit: Payer: Self-pay | Admitting: Physician Assistant

## 2018-10-08 ENCOUNTER — Telehealth: Payer: Self-pay | Admitting: Rheumatology

## 2018-10-08 MED ORDER — ZOLPIDEM TARTRATE 10 MG PO TABS: 10.0000 mg | ORAL_TABLET | Freq: Every day | ORAL | 0 refills | Status: DC

## 2018-10-08 NOTE — Telephone Encounter (Signed)
Patient needs refill on Ambien 10mg  sent to [facility].

## 2018-10-08 NOTE — Telephone Encounter (Signed)
Last Visit: 06/11/18 Next Visit: 11/11/18  Last Fill: 06/17/18  Okay to refill Ambien?

## 2018-10-28 NOTE — Progress Notes (Deleted)
Office Visit Note  Patient: Tammie [name]             Date of Birth: [birth year]           MRN: [id number]             PCP: Amelia Jo, FNP Referring: Amelia Jo, FNP Visit Date: 11/11/2018 Occupation: @GUAROCC @  Subjective:  No chief complaint on file.  Due for PLQ eye exam.  Last normal on 11/16/17.   History of Present Illness: Tammie [name] is a 68 y.o. female ***   Activities of Daily Living:  Patient reports morning stiffness for *** {minute/hour:19697}.   Patient {ACTIONS;DENIES/REPORTS:21021675::"Denies"} nocturnal pain.  Difficulty dressing/grooming: {ACTIONS;DENIES/REPORTS:21021675::"Denies"} Difficulty climbing stairs: {ACTIONS;DENIES/REPORTS:21021675::"Denies"} Difficulty getting out of chair: {ACTIONS;DENIES/REPORTS:21021675::"Denies"} Difficulty using hands for taps, buttons, cutlery, and/or writing: {ACTIONS;DENIES/REPORTS:21021675::"Denies"}  No Rheumatology ROS completed.   PMFS History:  Patient Active Problem List   Diagnosis Date Noted  . DDD (degenerative disc disease), lumbar 02/08/2017  . DDD (degenerative disc disease), cervical 02/08/2017  . Primary osteoarthritis of both hips 02/08/2017  . Bilateral primary osteoarthritis of knee 02/08/2017  . History of renal insufficiency 02/08/2017  . Plantar fasciitis 02/08/2017  . History of bilateral total hip arthroplasty 02/08/2017  . History of total knee replacement 02/08/2017  . History of depression 02/08/2017  . Trapezius muscle spasm 07/18/2016  . Bilateral sacroiliitis (HCC) 07/18/2016  . Rheumatoid arthritis involving multiple sites with positive rheumatoid factor (HCC) 07/17/2016  . High risk medications (not anticoagulants) long-term use 07/17/2016  . Fibromyalgia 07/17/2016  . Fatigue 07/17/2016  . Insomnia 07/17/2016    Past Medical History:  Diagnosis Date  . CRI (chronic renal insufficiency)   . DDD (degenerative disc disease), cervical   . DDD  (degenerative disc disease), lumbar   . Depression   . Fibromyalgia   . OA (osteoarthritis) of knee   . Osteoarthritis of hip   . Plantar fasciitis, bilateral   . Rheumatoid arthritis (HCC)     No family history on file. Past Surgical History:  Procedure Laterality Date  . ANKLE FRACTURE SURGERY    . APPENDECTOMY    . CESAREAN SECTION     x2  . ELBOW SURGERY    . JOINT REPLACEMENT     BIL knee   . JOINT REPLACEMENT     BIL hip  . KNEE ARTHROPLASTY    . NECK SURGERY    . REPLACEMENT TOTAL KNEE Bilateral   . TOTAL HIP ARTHROPLASTY Bilateral    Social History   Social History Narrative  . Not on file    There is no immunization history on file for this patient.   Objective: Vital Signs: There were no vitals taken for this visit.   Physical Exam   Musculoskeletal Exam: ***  CDAI Exam: CDAI Score: Not documented Patient Global Assessment: Not documented; Provider Global Assessment: Not documented Swollen: Not documented; Tender: Not documented Joint Exam   Not documented   There is currently no information documented on the homunculus. Go to the Rheumatology activity and complete the homunculus joint exam.  Investigation: No additional findings.  Imaging: No results found.  Recent Labs: Lab Results  Component Value Date   WBC 4.8 06/11/2018   HGB 13.0 06/11/2018   PLT 281 06/11/2018   NA 139 06/11/2018   K 4.5 06/11/2018   CL 103 06/11/2018   CO2 29 06/11/2018   GLUCOSE 86 06/11/2018   BUN 23 06/11/2018   CREATININE 1.05 (H) 06/11/2018  BILITOT 0.3 06/11/2018   ALKPHOS 98 02/16/2017   AST 20 06/11/2018   ALT 11 06/11/2018   PROT 6.6 06/11/2018   ALBUMIN 3.8 02/16/2017   CALCIUM 9.3 06/11/2018   GFRAA 64 06/11/2018    Speciality Comments: PLQ Eye Exam: 11/16/17 WNL @ [facility] Follow up in 1 year  Procedures:  No procedures performed Allergies: Patient has no known allergies.   Assessment / Plan:     Visit Diagnoses: Rheumatoid  arthritis involving multiple sites with positive rheumatoid factor (HCC)  High risk medications (not anticoagulants) long-term use - PLQ  Fibromyalgia  Primary insomnia - Ambien 10 mg po at bedtime  Other fatigue  DDD (degenerative disc disease), cervical  DDD (degenerative disc disease), lumbar  History of bilateral total hip arthroplasty  History of total bilateral knee replacement  History of renal insufficiency  History of depression  Chronic right SI joint pain  History of ankle fracture/ bilateral   Vitamin D deficiency   Orders: No orders of the defined types were placed in this encounter.  No orders of the defined types were placed in this encounter.   Face-to-face time spent with patient was *** minutes. Greater than 50% of time was spent in counseling and coordination of care.  Follow-Up Instructions: No follow-ups on file.   Gearldine Bienenstock, PA-C  Note - This record has been created using Dragon software.  Chart creation errors have been sought, but may not always  have been located. Such creation errors do not reflect on  the standard of medical care.

## 2018-11-11 ENCOUNTER — Ambulatory Visit: Payer: Medicare Other | Admitting: Physician Assistant

## 2018-11-21 ENCOUNTER — Other Ambulatory Visit: Payer: Self-pay | Admitting: Rheumatology

## 2018-11-22 NOTE — Telephone Encounter (Signed)
Last Visit: 06/11/18 Next Visit: 11/11/18 Labs: 06/11/18 CBC WNL. Creatinine and GFR stable. PLQ Eye Exam: 11/16/17 WNL   Okay to refill per Dr. Corliss Skains

## 2018-11-25 NOTE — Progress Notes (Signed)
Office Visit Note  Patient: Tammie [name]             Date of Birth: [birth year]           MRN: [id number]             PCP: Amelia Jo, FNP Referring: Amelia Jo, FNP Visit Date: 12/09/2018 Occupation: @GUAROCC @  Subjective:  Right shoulder pain    History of Present Illness: Tammie [name] is a 68 y.o. female with history of seropositive rheumatoid arthritis, fibromyalgia, and DDD.  She is on PLQ 200 mg 1 tablet BID M-F.  She is taking Fosamax 70 mg 1 tablet po once weekly for management of osteoporosis.  She reports increased pain in the right ankle joint. She denies any joint swelling.  She denies any RA flares. She states she is having trapezius muscle aches and muscle tenderness due to fibromyalgia.  She is having trapezius muscle tension and spasms.  She continues to take Ambien as prescribed but she has difficulty sleeping due to the discomfort she experiences.   Activities of Daily Living:  Patient reports morning stiffness for 1 hour.   Patient Reports nocturnal pain.  Difficulty dressing/grooming: Reports Difficulty climbing stairs: Reports Difficulty getting out of chair: Reports Difficulty using hands for taps, buttons, cutlery, and/or writing: Denies  Review of Systems  Constitutional: Positive for fatigue.  HENT: Negative for mouth sores, mouth dryness and nose dryness.   Eyes: Negative for pain, visual disturbance and dryness.  Respiratory: Negative for cough, hemoptysis, shortness of breath and difficulty breathing.   Cardiovascular: Negative for chest pain, palpitations, hypertension and swelling in legs/feet.  Gastrointestinal: Positive for constipation. Negative for blood in stool and diarrhea.  Endocrine: Negative for increased urination.  Genitourinary: Negative for painful urination.  Musculoskeletal: Positive for myalgias, morning stiffness, muscle tenderness and myalgias. Negative for arthralgias, joint pain, joint swelling and  muscle weakness.  Skin: Negative for color change, pallor, rash, hair loss, nodules/bumps, skin tightness, ulcers and sensitivity to sunlight.  Allergic/Immunologic: Negative for susceptible to infections.  Neurological: Negative for dizziness, numbness, headaches and weakness.  Hematological: Negative for swollen glands.  Psychiatric/Behavioral: Positive for sleep disturbance. Negative for depressed mood. The patient is not nervous/anxious.     PMFS History:  Patient Active Problem List   Diagnosis Date Noted  . DDD (degenerative disc disease), lumbar 02/08/2017  . DDD (degenerative disc disease), cervical 02/08/2017  . Primary osteoarthritis of both hips 02/08/2017  . Bilateral primary osteoarthritis of knee 02/08/2017  . History of renal insufficiency 02/08/2017  . Plantar fasciitis 02/08/2017  . History of bilateral total hip arthroplasty 02/08/2017  . History of total knee replacement 02/08/2017  . History of depression 02/08/2017  . Trapezius muscle spasm 07/18/2016  . Bilateral sacroiliitis (HCC) 07/18/2016  . Rheumatoid arthritis involving multiple sites with positive rheumatoid factor (HCC) 07/17/2016  . High risk medications (not anticoagulants) long-term use 07/17/2016  . Fibromyalgia 07/17/2016  . Fatigue 07/17/2016  . Insomnia 07/17/2016    Past Medical History:  Diagnosis Date  . CRI (chronic renal insufficiency)   . DDD (degenerative disc disease), cervical   . DDD (degenerative disc disease), lumbar   . Depression   . Fibromyalgia   . OA (osteoarthritis) of knee   . Osteoarthritis of hip   . Plantar fasciitis, bilateral   . Rheumatoid arthritis (HCC)     History reviewed. No pertinent family history. Past Surgical History:  Procedure Laterality Date  . ANKLE FRACTURE SURGERY    .  APPENDECTOMY    . CESAREAN SECTION     x2  . ELBOW SURGERY    . JOINT REPLACEMENT     BIL knee   . JOINT REPLACEMENT     BIL hip  . KNEE ARTHROPLASTY    . NECK SURGERY      . REPLACEMENT TOTAL KNEE Bilateral   . TOTAL HIP ARTHROPLASTY Bilateral    Social History   Social History Narrative  . Not on file   Immunization History  Administered Date(s) Administered  . Influenza,inj,Quad PF,6+ Mos 07/13/2016  . Influenza-Unspecified 07/13/2016  . Pneumococcal Conjugate-13 05/29/2017     Objective: Vital Signs: BP (!) 148/87 (BP Location: Left Arm, Patient Position: Sitting, Cuff Size: Normal)   Pulse 70   Resp 14   Ht 5' 5.98" (1.676 m)   Wt 165 lb (74.8 kg)   BMI 26.65 kg/m    Physical Exam Vitals signs and nursing note reviewed.  Constitutional:      Appearance: She is well-developed.  HENT:     Head: Normocephalic and atraumatic.  Eyes:     Conjunctiva/sclera: Conjunctivae normal.  Neck:     Musculoskeletal: Normal range of motion.  Cardiovascular:     Rate and Rhythm: Normal rate and regular rhythm.     Heart sounds: Normal heart sounds.  Pulmonary:     Effort: Pulmonary effort is normal.     Breath sounds: Normal breath sounds.  Abdominal:     General: Bowel sounds are normal.     Palpations: Abdomen is soft.  Lymphadenopathy:     Cervical: No cervical adenopathy.  Skin:    General: Skin is warm and dry.     Capillary Refill: Capillary refill takes less than 2 seconds.  Neurological:     Mental Status: She is alert and oriented to person, place, and time.  Psychiatric:        Behavior: Behavior normal.      Musculoskeletal Exam: Generalized hyperalgesia and positive tender points.  C-spine limited ROM.  Trapezius muscle spasms.  Shoulder joints, elbow joints, wrist joints, MCPs, PIPs, and DIPs good ROM with no synovitis. Right shoulder painful ROM. Bilateral 1st and 2nd MCP joint synovial thickening.  PIP and DIP synovial thickening consistent with osteoarthritis. Discomfort with bilateral hip ROM.  Tenderness over bilateral trochanteric bursa.  Knee joints, ankle joints, MTPs, PIPs, and DIPs good ROM with no synovitis. No warmth  or effusion of knee joints.    CDAI Exam: CDAI Score: 0.9  Patient Global Assessment: 7 (mm); Provider Global Assessment: 2 (mm) Swollen: 0 ; Tender: 0  Joint Exam   Not documented   There is currently no information documented on the homunculus. Go to the Rheumatology activity and complete the homunculus joint exam.  Investigation: No additional findings.  Imaging: No results found.  Recent Labs: Lab Results  Component Value Date   WBC 4.8 06/11/2018   HGB 13.0 06/11/2018   PLT 281 06/11/2018   NA 139 06/11/2018   K 4.5 06/11/2018   CL 103 06/11/2018   CO2 29 06/11/2018   GLUCOSE 86 06/11/2018   BUN 23 06/11/2018   CREATININE 1.05 (H) 06/11/2018   BILITOT 0.3 06/11/2018   ALKPHOS 98 02/16/2017   AST 20 06/11/2018   ALT 11 06/11/2018   PROT 6.6 06/11/2018   ALBUMIN 3.8 02/16/2017   CALCIUM 9.3 06/11/2018   GFRAA 64 06/11/2018    Speciality Comments: PLQ Eye Exam: 11/16/17 WNL @ [facility] Follow up  in 1 year  Procedures:  Large Joint Inj: R glenohumeral on 12/09/2018 10:22 AM Indications: pain Details: 27 G 1.5 in needle, posterior approach  Arthrogram: No  Medications: 1.5 mL lidocaine 1 %; 40 mg triamcinolone acetonide 40 MG/ML Aspirate: 0 mL Outcome: tolerated well, no immediate complications Procedure, treatment alternatives, risks and benefits explained, specific risks discussed. Consent was given by the patient. Immediately prior to procedure a time out was called to verify the correct patient, procedure, equipment, support staff and site/side marked as required. Patient was prepped and draped in the usual sterile fashion.     Allergies: Patient has no known allergies.   Assessment / Plan:     Visit Diagnoses: Rheumatoid arthritis involving multiple sites with positive rheumatoid factor (HCC): She has no synovitis on exam.  She has not had any recent rheumatoid arthritis flares.  She is clinically doing well on Plaquenil 200 mg 1 tablet twice  daily Monday through Friday.  She is having increased myalgias and muscle tenderness due to fibromyalgia by her rheumatoid arthritis has been well controlled.  She will continue on his current treatment regimen.  She does not need any refills at this time.  She was given a Plaquenil eye exam form to take with her to her next appointment.  CBC and CMP will be drawn today.  She is advised to notify us if she develops increased joint pain or joint swelling.  She will follow-up in the office in 5 months.  High risk medications (not anticoagulants) long-term use : Plaquenil 200 mg twice daily Monday through Friday only.  Last Plaquenil eye exam normal on 11/16/2017. She was given a PLQ eye exam form. Most recent CBC/CMP stable on 06/11/2018.  Due for CBC/CMP today and will monitor every 5 months.  Patient received Prevnar 13 in 2018.   - Plan: CBC with Differential/Platelet, COMPLETE METABOLIC PANEL WITH GFR  Fibromyalgia: She has generalized hyperalgesia and positive tender points on exam.  She is been having increased generalized muscle aches muscle tenderness due to fibromyalgia.  She has been having trapezius muscle spasms and muscle tension bilaterally.  She has bilateral trochanteric bursitis.  She continues to have chronic fatigue and insomnia.  She has been taking Ambien 10 mg by mouth at bedtime For sleep.  She is been experiencing right shoulder discomfort at night which has been making more difficult to sleep.  A right shoulder cortisone injection was performed today in the office.  The importance of regular size and good sleep hygiene was discussed.  Primary insomnia - She takes ambien 10 mg po at bedtime for insomnia.   Other fatigue: Chronic and related to insomnia.   DDD (degenerative disc disease), cervical: She has good ROM with some discomfort.  She has no symptoms of radiculopathy at this time   DDD (degenerative disc disease), lumbar: Chronic pain.    History of bilateral total hip  arthroplasty - Bilateral hip replacements performed by Dr. Cleophas Dunker: Doing well.  She has discomfort with ROM bilaterally.  She has bilateral trochanteric bursitis.  We opted out of performing cortisone injections due to elevated BP.   History of total bilateral knee replacement: Doing well.  No warmth or effusion.  She has no discomfort at this time.   Chronic right shoulder pain - She presents today with increased right shoulder pain. She has good ROM with discomfort.  She has trapezius muscle aches and muscle tenderness.  She has been experiencing pain at night.  She requested a right  shoulder cortisone injection.  She tolerated the procedure well.  She was advised to monitor blood pressure closely the cortisone injection.  X-rays of the right shoulder were obtained today. She was given a handout of shoulder exercises to perform. Plan: XR Shoulder Right  Age-related osteoporosis without current pathological fracture -She is on Fosamax 70 mg weekly managed by another provider. DEXA on 02/23/2017 showed T score of -2.7 right distal radius with BMD of 0.529. History of bilateral hip arthoplasty and spinal surgery.   Future DEXA order placed today. Plan: DG BONE DENSITY (DXA)  Other medical conditions are listed as follows:    History of renal insufficiency  History of depression    Orders: Orders Placed This Encounter  Procedures  . Trigger Point Inj  . Large Joint Inj  . DG BONE DENSITY (DXA)  . XR Shoulder Right  . CBC with Differential/Platelet  . COMPLETE METABOLIC PANEL WITH GFR   No orders of the defined types were placed in this encounter.   Face-to-face time spent with patient was 30 minutes. Greater than 50% of time was spent in counseling and coordination of care.  Follow-Up Instructions: Return in about 5 months (around 05/11/2019) for Rheumatoid arthritis, Fibromyalgia, DDD.   Gearldine Bienenstock, PA-C   I examined and evaluated the patient with Sherron Ales PA.  Patient had no  synovitis on examination.  Although she had multiple arthralgias and myalgias due to underlying fibromyalgia.  Per her request right shoulder joint was injected with cortisone as described above.  She will continue current treatment.  The plan of care was discussed as noted above.  Pollyann Savoy, MD  Note - This record has been created using Animal nutritionist.  Chart creation errors have been sought, but may not always  have been located. Such creation errors do not reflect on  the standard of medical care.

## 2018-12-09 ENCOUNTER — Other Ambulatory Visit: Payer: Self-pay | Admitting: *Deleted

## 2018-12-09 ENCOUNTER — Ambulatory Visit (INDEPENDENT_AMBULATORY_CARE_PROVIDER_SITE_OTHER): Payer: Medicare Other

## 2018-12-09 ENCOUNTER — Other Ambulatory Visit: Payer: Self-pay

## 2018-12-09 ENCOUNTER — Ambulatory Visit: Payer: Medicare Other | Admitting: Rheumatology

## 2018-12-09 ENCOUNTER — Encounter: Payer: Self-pay | Admitting: Rheumatology

## 2018-12-09 VITALS — BP 148/87 | HR 70 | Resp 14 | Ht 65.98 in | Wt 165.0 lb

## 2018-12-09 DIAGNOSIS — Z96653 Presence of artificial knee joint, bilateral: Secondary | ICD-10-CM

## 2018-12-09 DIAGNOSIS — M25511 Pain in right shoulder: Secondary | ICD-10-CM | POA: Diagnosis not present

## 2018-12-09 DIAGNOSIS — F5101 Primary insomnia: Secondary | ICD-10-CM

## 2018-12-09 DIAGNOSIS — M810 Age-related osteoporosis without current pathological fracture: Secondary | ICD-10-CM

## 2018-12-09 DIAGNOSIS — Z8659 Personal history of other mental and behavioral disorders: Secondary | ICD-10-CM

## 2018-12-09 DIAGNOSIS — M0579 Rheumatoid arthritis with rheumatoid factor of multiple sites without organ or systems involvement: Secondary | ICD-10-CM | POA: Diagnosis not present

## 2018-12-09 DIAGNOSIS — M797 Fibromyalgia: Secondary | ICD-10-CM | POA: Diagnosis not present

## 2018-12-09 DIAGNOSIS — Z87448 Personal history of other diseases of urinary system: Secondary | ICD-10-CM

## 2018-12-09 DIAGNOSIS — Z79899 Other long term (current) drug therapy: Secondary | ICD-10-CM

## 2018-12-09 DIAGNOSIS — M5030 Other cervical disc degeneration, unspecified cervical region: Secondary | ICD-10-CM

## 2018-12-09 DIAGNOSIS — G8929 Other chronic pain: Secondary | ICD-10-CM

## 2018-12-09 DIAGNOSIS — R5383 Other fatigue: Secondary | ICD-10-CM

## 2018-12-09 DIAGNOSIS — M5136 Other intervertebral disc degeneration, lumbar region: Secondary | ICD-10-CM

## 2018-12-09 DIAGNOSIS — Z96643 Presence of artificial hip joint, bilateral: Secondary | ICD-10-CM

## 2018-12-09 MED ORDER — HYDROXYCHLOROQUINE SULFATE 200 MG PO TABS: ORAL_TABLET | ORAL | 0 refills | Status: DC

## 2018-12-09 MED ORDER — LIDOCAINE HCL 1 % IJ SOLN
1.5000 mL | INTRAMUSCULAR | Status: AC | PRN
  Administered 2018-12-09: 1.5 mL

## 2018-12-09 MED ORDER — TRIAMCINOLONE ACETONIDE 40 MG/ML IJ SUSP
40.0000 mg | INTRAMUSCULAR | Status: AC | PRN
  Administered 2018-12-09: 40 mg via INTRA_ARTICULAR

## 2018-12-09 NOTE — Patient Instructions (Signed)
Shoulder Exercises Ask your health care provider which exercises are safe for you. Do exercises exactly as told by your health care provider and adjust them as directed. It is normal to feel mild stretching, pulling, tightness, or discomfort as you do these exercises, but you should stop right away if you feel sudden pain or your pain gets worse.Do not begin these exercises until told by your health care provider. Range of Motion Exercises        These exercises warm up your muscles and joints and improve the movement and flexibility of your shoulder. These exercises also help to relieve pain, numbness, and tingling. These exercises involve stretching your injured shoulder directly. Exercise A: Pendulum 1. Stand near a wall or a surface that you can hold onto for balance. 2. Bend at the waist and let your left / right arm hang straight down. Use your other arm to support you. Keep your back straight and do not lock your knees. 3. Relax your left / right arm and shoulder muscles, and move your hips and your trunk so your left / right arm swings freely. Your arm should swing because of the motion of your body, not because you are using your arm or shoulder muscles. 4. Keep moving your body so your arm swings in the following directions, as told by your health care provider: ? Side to side. ? Forward and backward. ? In clockwise and counterclockwise circles. 5. Continue each motion for __________ seconds, or for as long as told by your health care provider. 6. Slowly return to the starting position. Repeat __________ times. Complete this exercise __________ times a day. Exercise B:Flexion, Standing 1. Stand and hold a broomstick, a cane, or a similar object. Place your hands a little more than shoulder-width apart on the object. Your left / right hand should be palm-up, and your other hand should be palm-down. 2. Keep your elbow straight and keep your shoulder muscles relaxed. Push the stick  down with your healthy arm to raise your left / right arm in front of your body, and then over your head until you feel a stretch in your shoulder. ? Avoid shrugging your shoulder while you raise your arm. Keep your shoulder blade tucked down toward the middle of your back. 3. Hold for __________ seconds. 4. Slowly return to the starting position. Repeat __________ times. Complete this exercise __________ times a day. Exercise C: Abduction, Standing 1. Stand and hold a broomstick, a cane, or a similar object. Place your hands a little more than shoulder-width apart on the object. Your left / right hand should be palm-up, and your other hand should be palm-down. 2. While keeping your elbow straight and your shoulder muscles relaxed, push the stick across your body toward your left / right side. Raise your left / right arm to the side of your body and then over your head until you feel a stretch in your shoulder. ? Do not raise your arm above shoulder height, unless your health care provider tells you to do that. ? Avoid shrugging your shoulder while you raise your arm. Keep your shoulder blade tucked down toward the middle of your back. 3. Hold for __________ seconds. 4. Slowly return to the starting position. Repeat __________ times. Complete this exercise __________ times a day. Exercise D:Internal Rotation 1. Place your left / right hand behind your back, palm-up. 2. Use your other hand to dangle an exercise band, a towel, or a similar object over your shoulder.   Grasp the band with your left / right hand so you are holding onto both ends. 3. Gently pull up on the band until you feel a stretch in the front of your left / right shoulder. ? Avoid shrugging your shoulder while you raise your arm. Keep your shoulder blade tucked down toward the middle of your back. 4. Hold for __________ seconds. 5. Release the stretch by letting go of the band and lowering your hands. Repeat __________ times.  Complete this exercise __________ times a day. Stretching Exercises  These exercises warm up your muscles and joints and improve the movement and flexibility of your shoulder. These exercises also help to relieve pain, numbness, and tingling. These exercises are done using your healthy shoulder to help stretch the muscles of your injured shoulder. Exercise E: Corner Stretch (External Rotation and Abduction) 1. Stand in a doorway with one of your feet slightly in front of the other. This is called a staggered stance. If you cannot reach your forearms to the door frame, stand facing a corner of a room. 2. Choose one of the following positions as told by your health care provider: ? Place your hands and forearms on the door frame above your head. ? Place your hands and forearms on the door frame at the height of your head. ? Place your hands on the door frame at the height of your elbows. 3. Slowly move your weight onto your front foot until you feel a stretch across your chest and in the front of your shoulders. Keep your head and chest upright and keep your abdominal muscles tight. 4. Hold for __________ seconds. 5. To release the stretch, shift your weight to your back foot. Repeat __________ times. Complete this stretch __________ times a day. Exercise F:Extension, Standing 1. Stand and hold a broomstick, a cane, or a similar object behind your back. ? Your hands should be a little wider than shoulder-width apart. ? Your palms should face away from your back. 2. Keeping your elbows straight and keeping your shoulder muscles relaxed, move the stick away from your body until you feel a stretch in your shoulder. ? Avoid shrugging your shoulders while you move the stick. Keep your shoulder blade tucked down toward the middle of your back. 3. Hold for __________ seconds. 4. Slowly return to the starting position. Repeat __________ times. Complete this exercise __________ times a  day. Strengthening Exercises           These exercises build strength and endurance in your shoulder. Endurance is the ability to use your muscles for a long time, even after they get tired. Exercise G:External Rotation 1. Sit in a stable chair without armrests. 2. Secure an exercise band at elbow height on your left / right side. 3. Place a soft object, such as a folded towel or a small pillow, between your left / right upper arm and your body to move your elbow a few inches away (about 10 cm) from your side. 4. Hold the end of the band so it is tight and there is no slack. 5. Keeping your elbow pressed against the soft object, move your left / right forearm out, away from your abdomen. Keep your body steady so only your forearm moves. 6. Hold for __________ seconds. 7. Slowly return to the starting position. Repeat __________ times. Complete this exercise __________ times a day. Exercise H:Shoulder Abduction 1. Sit in a stable chair without armrests, or stand. 2. Hold a __________ weight in your   left / right hand, or hold an exercise band with both hands. 3. Start with your arms straight down and your left / right palm facing in, toward your body. 4. Slowly lift your left / right hand out to your side. Do not lift your hand above shoulder height unless your health care provider tells you that this is safe. ? Keep your arms straight. ? Avoid shrugging your shoulder while you do this movement. Keep your shoulder blade tucked down toward the middle of your back. 5. Hold for __________ seconds. 6. Slowly lower your arm, and return to the starting position. Repeat __________ times. Complete this exercise __________ times a day. Exercise I:Shoulder Extension 1. Sit in a stable chair without armrests, or stand. 2. Secure an exercise band to a stable object in front of you where it is at shoulder height. 3. Hold one end of the exercise band in each hand. Your palms should face each  other. 4. Straighten your elbows and lift your hands up to shoulder height. 5. Step back, away from the secured end of the exercise band, until the band is tight and there is no slack. 6. Squeeze your shoulder blades together as you pull your hands down to the sides of your thighs. Stop when your hands are straight down by your sides. Do not let your hands go behind your body. 7. Hold for __________ seconds. 8. Slowly return to the starting position. Repeat __________ times. Complete this exercise __________ times a day. Exercise J:Standing Shoulder Row 1. Sit in a stable chair without armrests, or stand. 2. Secure an exercise band to a stable object in front of you so it is at waist height. 3. Hold one end of the exercise band in each hand. Your palms should be in a thumbs-up position. 4. Bend each of your elbows to an "L" shape (about 90 degrees) and keep your upper arms at your sides. 5. Step back until the band is tight and there is no slack. 6. Slowly pull your elbows back behind you. 7. Hold for __________ seconds. 8. Slowly return to the starting position. Repeat __________ times. Complete this exercise __________ times a day. Exercise K:Shoulder Press-Ups 1. Sit in a stable chair that has armrests. Sit upright, with your feet flat on the floor. 2. Put your hands on the armrests so your elbows are bent and your fingers are pointing forward. Your hands should be about even with the sides of your body. 3. Push down on the armrests and use your arms to lift yourself off of the chair. Straighten your elbows and lift yourself up as much as you comfortably can. ? Move your shoulder blades down, and avoid letting your shoulders move up toward your ears. ? Keep your feet on the ground. As you get stronger, your feet should support less of your body weight as you lift yourself up. 4. Hold for __________ seconds. 5. Slowly lower yourself back into the chair. Repeat __________ times. Complete  this exercise __________ times a day. Exercise L: Wall Push-Ups 1. Stand so you are facing a stable wall. Your feet should be about one arm-length away from the wall. 2. Lean forward and place your palms on the wall at shoulder height. 3. Keep your feet flat on the floor as you bend your elbows and lean forward toward the wall. 4. Hold for __________ seconds. 5. Straighten your elbows to push yourself back to the starting position. Repeat __________ times. Complete this exercise __________ times   a day. This information is not intended to replace advice given to you by your health care provider. Make sure you discuss any questions you have with your health care provider. Document Released: 07/26/2005 Document Revised: 01/15/2018 Document Reviewed: 05/23/2015 Elsevier Interactive Patient Education  2019 [employer]  

## 2018-12-09 NOTE — Telephone Encounter (Signed)
Refill request received via fax  Last Visit: 12/09/18 Next Visit: 05/12/19 Labs: 06/11/18 CBC WNL. Creatinine and GFR stable. Labs updated today Eye exam: 2/29/19 WNL  Okay to refill per Dr. Corliss Skains   Patient's husband advised patient needs to update PLQ eye exam and that shoulder x-ray from appointment was normal

## 2018-12-10 LAB — CBC WITH DIFFERENTIAL/PLATELET
Absolute Monocytes: 504 cells/uL (ref 200–950)
Basophils Absolute: 42 cells/uL (ref 0–200)
Basophils Relative: 0.8 %
Eosinophils Absolute: 170 cells/uL (ref 15–500)
Eosinophils Relative: 3.2 %
HCT: 41.5 % (ref 35.0–45.0)
Hemoglobin: 13.7 g/dL (ref 11.7–15.5)
Lymphs Abs: 1081 cells/uL (ref 850–3900)
MCH: 31.4 pg (ref 27.0–33.0)
MCHC: 33 g/dL (ref 32.0–36.0)
MCV: 95 fL (ref 80.0–100.0)
MPV: 9.5 fL (ref 7.5–12.5)
Monocytes Relative: 9.5 %
Neutro Abs: 3503 cells/uL (ref 1500–7800)
Neutrophils Relative %: 66.1 %
Platelets: 258 10*3/uL (ref 140–400)
RBC: 4.37 10*6/uL (ref 3.80–5.10)
RDW: 12.5 % (ref 11.0–15.0)
Total Lymphocyte: 20.4 %
WBC: 5.3 10*3/uL (ref 3.8–10.8)

## 2018-12-10 LAB — COMPLETE METABOLIC PANEL WITH GFR
AG Ratio: 1.9 (calc) (ref 1.0–2.5)
ALT: 12 U/L (ref 6–29)
AST: 17 U/L (ref 10–35)
Albumin: 4.2 g/dL (ref 3.6–5.1)
Alkaline phosphatase (APISO): 67 U/L (ref 37–153)
BUN / CREAT RATIO: 17 (calc) (ref 6–22)
BUN: 17 mg/dL (ref 7–25)
CO2: 26 mmol/L (ref 20–32)
Calcium: 9.3 mg/dL (ref 8.6–10.4)
Chloride: 104 mmol/L (ref 98–110)
Creat: 1.03 mg/dL — ABNORMAL HIGH (ref 0.50–0.99)
GFR, Est African American: 66 mL/min/{1.73_m2} (ref >=60)
GFR, Est Non African American: 57 mL/min/{1.73_m2} — ABNORMAL LOW (ref >=60)
Globulin: 2.2 g/dL (calc) (ref 1.9–3.7)
Glucose, Bld: 71 mg/dL (ref 65–99)
Potassium: 4.2 mmol/L (ref 3.5–5.3)
Sodium: 140 mmol/L (ref 135–146)
Total Bilirubin: 0.4 mg/dL (ref 0.2–1.2)
Total Protein: 6.4 g/dL (ref 6.1–8.1)

## 2018-12-10 NOTE — Progress Notes (Signed)
CBC WNL. Creatinine and GFR are stable.

## 2018-12-16 ENCOUNTER — Other Ambulatory Visit: Payer: Self-pay | Admitting: Physician Assistant

## 2018-12-16 NOTE — Telephone Encounter (Signed)
Ok to refill 

## 2018-12-16 NOTE — Telephone Encounter (Signed)
Last Visit: 12/09/2018 Next Visit: 05/12/2019 Last fill: 10/08/2018  Okay to refill per Dr. Corliss Skains.

## 2019-03-30 ENCOUNTER — Other Ambulatory Visit: Payer: Self-pay | Admitting: Rheumatology

## 2019-03-31 NOTE — Telephone Encounter (Addendum)
Last Visit: 12/09/2018 Next Visit: 05/12/2019 Labs:12/09/18 CBC WNL. Creatinine and GFR are stable. Eye exam: 2/29/19 WNL  Patient advised she is due to update PLQ eye exam. Patient will schedule an appointment.   Okay to refill PLQ and Ambien?

## 2019-03-31 NOTE — Telephone Encounter (Signed)
Ok to refill 

## 2019-04-29 NOTE — Progress Notes (Signed)
Office Visit Note  Patient: Tammie PottKrystyna E Monaco             Date of Birth: Jul 11, 1951           MRN: [id number]             PCP: Amelia Joousins, Melissa A, FNP Referring: Amelia Joousins, Melissa A, FNP Visit Date: 05/12/2019 Occupation: @GUAROCC @  Subjective:  Left shoulder pain.   History of Present Illness: Tammie PottKrystyna E Dewolfe is a 68 y.o. female with history of rheumatoid arthritis and osteoarthritis.  She states she has been having pain and discomfort in her left shoulder now.  She came before for the right shoulder joint pain and had cortisone injection from which she responded well.  None of the other point joints are painful or swollen.  She had bilateral total hip replacement and bilateral total knee replacements which are doing well.  She denies discomfort in any other joints.  She just continues to have some generalized pain from fibromyalgia.  Activities of Daily Living:  Patient reports morning stiffness for 30 minutes.   Patient Reports nocturnal pain.  Difficulty dressing/grooming: Reports Difficulty climbing stairs: Denies Difficulty getting out of chair: Denies Difficulty using hands for taps, buttons, cutlery, and/or writing: Denies  Review of Systems  Constitutional: Positive for fatigue. Negative for night sweats, weight gain and weight loss.  HENT: Negative for mouth sores, trouble swallowing, trouble swallowing, mouth dryness and nose dryness.   Eyes: Negative for pain, redness, visual disturbance and dryness.  Respiratory: Negative for cough, shortness of breath and difficulty breathing.   Cardiovascular: Negative for chest pain, palpitations, hypertension, irregular heartbeat and swelling in legs/feet.  Gastrointestinal: Negative for blood in stool, constipation and diarrhea.  Endocrine: Negative for increased urination.  Genitourinary: Negative for vaginal dryness.  Musculoskeletal: Positive for arthralgias, joint pain, myalgias, morning stiffness and myalgias. Negative  for joint swelling, muscle weakness and muscle tenderness.  Skin: Negative for color change, rash, hair loss, skin tightness, ulcers and sensitivity to sunlight.  Allergic/Immunologic: Negative for susceptible to infections.  Neurological: Negative for dizziness, memory loss, night sweats and weakness.  Hematological: Negative for bruising/bleeding tendency and swollen glands.  Psychiatric/Behavioral: Negative for depressed mood and sleep disturbance. The patient is not nervous/anxious.     PMFS History:  Patient Active Problem List   Diagnosis Date Noted   DDD (degenerative disc disease), lumbar 02/08/2017   DDD (degenerative disc disease), cervical 02/08/2017   Primary osteoarthritis of both hips 02/08/2017   Bilateral primary osteoarthritis of knee 02/08/2017   History of renal insufficiency 02/08/2017   Plantar fasciitis 02/08/2017   History of bilateral total hip arthroplasty 02/08/2017   History of total knee replacement 02/08/2017   History of depression 02/08/2017   Trapezius muscle spasm 07/18/2016   Bilateral sacroiliitis (HCC) 07/18/2016   Rheumatoid arthritis involving multiple sites with positive rheumatoid factor (HCC) 07/17/2016   High risk medications (not anticoagulants) long-term use 07/17/2016   Fibromyalgia 07/17/2016   Fatigue 07/17/2016   Insomnia 07/17/2016    Past Medical History:  Diagnosis Date   CRI (chronic renal insufficiency)    DDD (degenerative disc disease), cervical    DDD (degenerative disc disease), lumbar    Depression    Fibromyalgia    OA (osteoarthritis) of knee    Osteoarthritis of hip    Plantar fasciitis, bilateral    Rheumatoid arthritis (HCC)     History reviewed. No pertinent family history. Past Surgical History:  Procedure Laterality Date   ANKLE FRACTURE  SURGERY     APPENDECTOMY     CESAREAN SECTION     x2   ELBOW SURGERY     JOINT REPLACEMENT     BIL knee    JOINT REPLACEMENT     BIL  hip   KNEE ARTHROPLASTY     NECK SURGERY     REPLACEMENT TOTAL KNEE Bilateral    TOTAL HIP ARTHROPLASTY Bilateral    Social History   Social History Narrative   Not on file   Immunization History  Administered Date(s) Administered   Influenza,inj,Quad PF,6+ Mos 07/13/2016   Influenza-Unspecified 07/13/2016   Pneumococcal Conjugate-13 05/29/2017     Objective: Vital Signs: BP (!) 146/92 (BP Location: Right Arm, Patient Position: Sitting, Cuff Size: Normal)    Pulse 91    Resp 12    Ht 5\' 6"  (1.676 m)    Wt 166 lb (75.3 kg)    BMI 26.79 kg/m    Physical Exam Vitals signs and nursing note reviewed.  Constitutional:      Appearance: She is well-developed.  HENT:     Head: Normocephalic and atraumatic.  Eyes:     Conjunctiva/sclera: Conjunctivae normal.  Neck:     Musculoskeletal: Normal range of motion.  Cardiovascular:     Rate and Rhythm: Normal rate and regular rhythm.     Heart sounds: Normal heart sounds.  Pulmonary:     Effort: Pulmonary effort is normal.     Breath sounds: Normal breath sounds.  Abdominal:     General: Bowel sounds are normal.     Palpations: Abdomen is soft.  Lymphadenopathy:     Cervical: No cervical adenopathy.  Skin:    General: Skin is warm and dry.     Capillary Refill: Capillary refill takes less than 2 seconds.  Neurological:     Mental Status: She is alert and oriented to person, place, and time.  Psychiatric:        Behavior: Behavior normal.      Musculoskeletal Exam: C-spine was good range of motion.  Right shoulder in abduction was limited to 40 degrees.  Elbow joints, wrist joints, MCPs and PIPs with good range of motion.  She has DIP and PIP thickening consistent with osteoarthritis.  No synovitis was noted.  She has limited range of motion of bilateral hip joint without discomfort.  She has right knee joint limited extension.  No synovitis was noted.  CDAI Exam: CDAI Score: 1.8  Patient Global: 4 mm; Provider Global:  4 mm Swollen: 0 ; Tender: 1  Joint Exam      Right  Left  Glenohumeral      Tender     Investigation: No additional findings.  Imaging: Xr Shoulder Left  Result Date: 05/12/2019 No significant glenohumeral joint space narrowing was noted.  No acromioclavicular arthritis was noted.  No chondrocalcinosis was noted.   Recent Labs: Lab Results  Component Value Date   WBC 5.3 12/09/2018   HGB 13.7 12/09/2018   PLT 258 12/09/2018   NA 140 12/09/2018   K 4.2 12/09/2018   CL 104 12/09/2018   CO2 26 12/09/2018   GLUCOSE 71 12/09/2018   BUN 17 12/09/2018   CREATININE 1.03 (H) 12/09/2018   BILITOT 0.4 12/09/2018   ALKPHOS 98 02/16/2017   AST 17 12/09/2018   ALT 12 12/09/2018   PROT 6.4 12/09/2018   ALBUMIN 3.8 02/16/2017   CALCIUM 9.3 12/09/2018   GFRAA 66 12/09/2018    Speciality Comments: PLQ Eye Exam:  11/16/17 WNL @ [facility] Follow up in 1 year Osteoporosis managed by another provider.  Procedures:  No procedures performed Allergies: Patient has no known allergies.   Assessment / Plan:     Visit Diagnoses: Rheumatoid arthritis involving multiple sites with positive rheumatoid factor (HCC) -patient has no synovitis on examination today.  She has been doing well on Plaquenil.  High risk medications (not anticoagulants) long-term use - Plaquenil 200 mg twice daily Monday through Friday only.eye exam: 11/16/2017 - Plan: CBC with Differential/Platelet, COMPLETE METABOLIC PANEL WITH GFR, today and then every 5 months.  Chronic left shoulder pain - Plan: XR Shoulder Left, she has very limited range of motion of her left shoulder which is painful.  X-ray obtained today was unremarkable.  Per her request the left shoulder joint was injected with cortisone after indications side effects contraindications were discussed.  The procedure was described above.  She tolerated the procedure well.  History of bilateral total hip arthroplasty -doing well.  History of total  bilateral knee replacement -doing well.  She has limited extension of her right knee joint.  DDD (degenerative disc disease), cervical -she has good range of motion.  DDD (degenerative disc disease), lumbar -she is chronic discomfort in her lower back.  Fibromyalgia -she has generalized pain and discomfort from fibromyalgia.  Other fatigue -chronic fatigue from insomnia and fibromyalgia.  Primary insomnia -  ambien 10 mg po at bedtime for insomnia.    Age-related osteoporosis without current pathological fracture - She is on Fosamax 70 mg weekly managed by another provider. DEXA on 02/23/2017 showed T score of -2.7 right distal radius with BMD of 0.529 - Plan: Patient is getting Fosamax from her PCP now.  She also has an upcoming DEXA scan.  History of depression -doing better  History of renal insufficiency -her GFR is in 60s.  Orders: Orders Placed This Encounter  Procedures   XR Shoulder Left   CBC with Differential/Platelet   COMPLETE METABOLIC PANEL WITH GFR   No orders of the defined types were placed in this encounter.   Face-to-face time spent with patient was 30 minutes. Greater than 50% of time was spent in counseling and coordination of care.  Follow-Up Instructions: Return in about 5 months (around 10/12/2019) for Rheumatoid arthritis, Osteoarthritis.   Pollyann SavoyShaili Rasheema Truluck, MD  Note - This record has been created using Animal nutritionistDragon software.  Chart creation errors have been sought, but may not always  have been located. Such creation errors do not reflect on  the standard of medical care.

## 2019-05-12 ENCOUNTER — Ambulatory Visit: Payer: Self-pay

## 2019-05-12 ENCOUNTER — Ambulatory Visit: Payer: Medicare Other | Admitting: Rheumatology

## 2019-05-12 ENCOUNTER — Other Ambulatory Visit: Payer: Self-pay

## 2019-05-12 ENCOUNTER — Encounter: Payer: Self-pay | Admitting: Rheumatology

## 2019-05-12 VITALS — BP 146/92 | HR 91 | Resp 12 | Ht 66.0 in | Wt 166.0 lb

## 2019-05-12 DIAGNOSIS — M0579 Rheumatoid arthritis with rheumatoid factor of multiple sites without organ or systems involvement: Secondary | ICD-10-CM | POA: Diagnosis not present

## 2019-05-12 DIAGNOSIS — M810 Age-related osteoporosis without current pathological fracture: Secondary | ICD-10-CM

## 2019-05-12 DIAGNOSIS — R5383 Other fatigue: Secondary | ICD-10-CM

## 2019-05-12 DIAGNOSIS — M5030 Other cervical disc degeneration, unspecified cervical region: Secondary | ICD-10-CM

## 2019-05-12 DIAGNOSIS — Z87448 Personal history of other diseases of urinary system: Secondary | ICD-10-CM

## 2019-05-12 DIAGNOSIS — M5136 Other intervertebral disc degeneration, lumbar region: Secondary | ICD-10-CM

## 2019-05-12 DIAGNOSIS — Z79899 Other long term (current) drug therapy: Secondary | ICD-10-CM | POA: Diagnosis not present

## 2019-05-12 DIAGNOSIS — F5101 Primary insomnia: Secondary | ICD-10-CM

## 2019-05-12 DIAGNOSIS — Z96653 Presence of artificial knee joint, bilateral: Secondary | ICD-10-CM

## 2019-05-12 DIAGNOSIS — Z8659 Personal history of other mental and behavioral disorders: Secondary | ICD-10-CM

## 2019-05-12 DIAGNOSIS — M25512 Pain in left shoulder: Secondary | ICD-10-CM | POA: Diagnosis not present

## 2019-05-12 DIAGNOSIS — Z96643 Presence of artificial hip joint, bilateral: Secondary | ICD-10-CM

## 2019-05-12 DIAGNOSIS — M797 Fibromyalgia: Secondary | ICD-10-CM

## 2019-05-12 DIAGNOSIS — G8929 Other chronic pain: Secondary | ICD-10-CM

## 2019-05-13 LAB — CBC WITH DIFFERENTIAL/PLATELET
Absolute Monocytes: 493 cells/uL (ref 200–950)
Basophils Absolute: 42 cells/uL (ref 0–200)
Basophils Relative: 0.8 %
Eosinophils Absolute: 122 cells/uL (ref 15–500)
Eosinophils Relative: 2.3 %
HCT: 41.4 % (ref 35.0–45.0)
Hemoglobin: 13.8 g/dL (ref 11.7–15.5)
Lymphs Abs: 1405 cells/uL (ref 850–3900)
MCH: 31.9 pg (ref 27.0–33.0)
MCHC: 33.3 g/dL (ref 32.0–36.0)
MCV: 95.8 fL (ref 80.0–100.0)
MPV: 9.8 fL (ref 7.5–12.5)
Monocytes Relative: 9.3 %
Neutro Abs: 3238 cells/uL (ref 1500–7800)
Neutrophils Relative %: 61.1 %
Platelets: 284 10*3/uL (ref 140–400)
RBC: 4.32 10*6/uL (ref 3.80–5.10)
RDW: 12.4 % (ref 11.0–15.0)
Total Lymphocyte: 26.5 %
WBC: 5.3 10*3/uL (ref 3.8–10.8)

## 2019-05-13 LAB — COMPLETE METABOLIC PANEL WITH GFR
AG Ratio: 1.9 (calc) (ref 1.0–2.5)
ALT: 14 U/L (ref 6–29)
AST: 16 U/L (ref 10–35)
Albumin: 4.1 g/dL (ref 3.6–5.1)
Alkaline phosphatase (APISO): 65 U/L (ref 37–153)
BUN/Creatinine Ratio: 16 (calc) (ref 6–22)
BUN: 18 mg/dL (ref 7–25)
CO2: 25 mmol/L (ref 20–32)
Calcium: 9.4 mg/dL (ref 8.6–10.4)
Chloride: 104 mmol/L (ref 98–110)
Creat: 1.11 mg/dL — ABNORMAL HIGH (ref 0.50–0.99)
GFR, Est African American: 60 mL/min/{1.73_m2} (ref >=60)
GFR, Est Non African American: 51 mL/min/{1.73_m2} — ABNORMAL LOW (ref >=60)
Globulin: 2.2 g/dL (calc) (ref 1.9–3.7)
Glucose, Bld: 84 mg/dL (ref 65–99)
Potassium: 4.5 mmol/L (ref 3.5–5.3)
Sodium: 142 mmol/L (ref 135–146)
Total Bilirubin: 0.4 mg/dL (ref 0.2–1.2)
Total Protein: 6.3 g/dL (ref 6.1–8.1)

## 2019-05-13 NOTE — Progress Notes (Signed)
Labs are stable.  GFR is low.  Please forward a copy of these labs to her PCP.

## 2019-08-21 ENCOUNTER — Other Ambulatory Visit (INDEPENDENT_AMBULATORY_CARE_PROVIDER_SITE_OTHER): Payer: Self-pay | Admitting: Orthopedic Surgery

## 2019-08-22 NOTE — Telephone Encounter (Signed)
I believe this is your patient

## 2019-08-25 ENCOUNTER — Other Ambulatory Visit (INDEPENDENT_AMBULATORY_CARE_PROVIDER_SITE_OTHER): Payer: Self-pay | Admitting: Orthopedic Surgery

## 2019-08-25 NOTE — Telephone Encounter (Signed)
Please see below.

## 2019-08-26 NOTE — Telephone Encounter (Signed)
Think this is Dr Anette Guarneri pts

## 2019-08-26 NOTE — Telephone Encounter (Signed)
Please advise 

## 2019-08-26 NOTE — Telephone Encounter (Signed)
See below

## 2019-08-27 ENCOUNTER — Telehealth: Payer: Self-pay | Admitting: Rheumatology

## 2019-08-27 NOTE — Telephone Encounter (Signed)
Per [facility], prescription has been mailed to the patient.

## 2019-08-27 NOTE — Telephone Encounter (Signed)
Patient needs refill on Zolpidem sent to [facility]. Patient's husband also wants to know if a week dose can be sent to [facility] on [address]/ [address] Because she is out. Original request went to Dr. Durward Fortes. Please call to advise.

## 2019-08-27 NOTE — Telephone Encounter (Signed)
Attempted to contact patient and left message on machine to advise patient refill was sent to the pharmacy on 08/25/2019. Advised patient to contact the office.

## 2019-10-13 NOTE — Progress Notes (Deleted)
Office Visit Note  Patient: Tammie [name]             Date of Birth: 02-22-1951           MRN: [id number]             PCP: Amelia Jo, FNP Referring: Amelia Jo, FNP Visit Date: 10/16/2019 Occupation: @GUAROCC @  Subjective:  No chief complaint on file.   History of Present Illness: Tammie [name] is a 69 y.o. female ***   Activities of Daily Living:  Patient reports morning stiffness for *** {minute/hour:19697}.   Patient {ACTIONS;DENIES/REPORTS:21021675::"Denies"} nocturnal pain.  Difficulty dressing/grooming: {ACTIONS;DENIES/REPORTS:21021675::"Denies"} Difficulty climbing stairs: {ACTIONS;DENIES/REPORTS:21021675::"Denies"} Difficulty getting out of chair: {ACTIONS;DENIES/REPORTS:21021675::"Denies"} Difficulty using hands for taps, buttons, cutlery, and/or writing: {ACTIONS;DENIES/REPORTS:21021675::"Denies"}  No Rheumatology ROS completed.   PMFS History:  Patient Active Problem List   Diagnosis Date Noted  . DDD (degenerative disc disease), lumbar 02/08/2017  . DDD (degenerative disc disease), cervical 02/08/2017  . Primary osteoarthritis of both hips 02/08/2017  . Bilateral primary osteoarthritis of knee 02/08/2017  . History of renal insufficiency 02/08/2017  . Plantar fasciitis 02/08/2017  . History of bilateral total hip arthroplasty 02/08/2017  . History of total knee replacement 02/08/2017  . History of depression 02/08/2017  . Trapezius muscle spasm 07/18/2016  . Bilateral sacroiliitis (HCC) 07/18/2016  . Rheumatoid arthritis involving multiple sites with positive rheumatoid factor (HCC) 07/17/2016  . High risk medications (not anticoagulants) long-term use 07/17/2016  . Fibromyalgia 07/17/2016  . Fatigue 07/17/2016  . Insomnia 07/17/2016    Past Medical History:  Diagnosis Date  . CRI (chronic renal insufficiency)   . DDD (degenerative disc disease), cervical   . DDD (degenerative disc disease), lumbar   . Depression   .  Fibromyalgia   . OA (osteoarthritis) of knee   . Osteoarthritis of hip   . Plantar fasciitis, bilateral   . Rheumatoid arthritis (HCC)     No family history on file. Past Surgical History:  Procedure Laterality Date  . ANKLE FRACTURE SURGERY    . APPENDECTOMY    . CESAREAN SECTION     x2  . ELBOW SURGERY    . JOINT REPLACEMENT     BIL knee   . JOINT REPLACEMENT     BIL hip  . KNEE ARTHROPLASTY    . NECK SURGERY    . REPLACEMENT TOTAL KNEE Bilateral   . TOTAL HIP ARTHROPLASTY Bilateral    Social History   Social History Narrative  . Not on file   Immunization History  Administered Date(s) Administered  . Influenza,inj,Quad PF,6+ Mos 07/13/2016  . Influenza-Unspecified 07/13/2016  . Pneumococcal Conjugate-13 05/29/2017     Objective: Vital Signs: There were no vitals taken for this visit.   Physical Exam   Musculoskeletal Exam: ***  CDAI Exam: CDAI Score: -- Patient Global: --; Provider Global: -- Swollen: --; Tender: -- Joint Exam 10/16/2019   No joint exam has been documented for this visit   There is currently no information documented on the homunculus. Go to the Rheumatology activity and complete the homunculus joint exam.  Investigation: No additional findings.  Imaging: No results found.  Recent Labs: Lab Results  Component Value Date   WBC 5.3 05/12/2019   HGB 13.8 05/12/2019   PLT 284 05/12/2019   NA 142 05/12/2019   K 4.5 05/12/2019   CL 104 05/12/2019   CO2 25 05/12/2019   GLUCOSE 84 05/12/2019   BUN 18 05/12/2019   CREATININE 1.11 (  H) 05/12/2019   BILITOT 0.4 05/12/2019   ALKPHOS 98 02/16/2017   AST 16 05/12/2019   ALT 14 05/12/2019   PROT 6.3 05/12/2019   ALBUMIN 3.8 02/16/2017   CALCIUM 9.4 05/12/2019   GFRAA 60 05/12/2019    Speciality Comments: PLQ Eye Exam: 05/22/19 WNL @ [facility] Follow up in 1 year Osteoporosis managed by another provider.  Procedures:  No procedures performed Allergies: Patient has no  known allergies.   Assessment / Plan:     Visit Diagnoses: No diagnosis found.  Orders: No orders of the defined types were placed in this encounter.  No orders of the defined types were placed in this encounter.   Face-to-face time spent with patient was *** minutes. Greater than 50% of time was spent in counseling and coordination of care.  Follow-Up Instructions: No follow-ups on file.   Earnestine Mealing, CMA  Note - This record has been created using Editor, commissioning.  Chart creation errors have been sought, but may not always  have been located. Such creation errors do not reflect on  the standard of medical care.

## 2019-10-15 ENCOUNTER — Ambulatory Visit: Payer: Medicare Other | Admitting: Rheumatology

## 2019-10-16 ENCOUNTER — Ambulatory Visit: Payer: Medicare Other | Admitting: Rheumatology

## 2019-10-24 NOTE — Progress Notes (Deleted)
Office Visit Note  Patient: Tammie [name]             Date of Birth: Jan 29, 1951           MRN: [id number]             PCP: Amelia Jo, FNP Referring: Amelia Jo, FNP Visit Date: 10/30/2019 Occupation: @GUAROCC @  Subjective:  No chief complaint on file.   History of Present Illness: Tammie [name] is a 69 y.o. female ***   Activities of Daily Living:  Patient reports morning stiffness for *** {minute/hour:19697}.   Patient {ACTIONS;DENIES/REPORTS:21021675::"Denies"} nocturnal pain.  Difficulty dressing/grooming: {ACTIONS;DENIES/REPORTS:21021675::"Denies"} Difficulty climbing stairs: {ACTIONS;DENIES/REPORTS:21021675::"Denies"} Difficulty getting out of chair: {ACTIONS;DENIES/REPORTS:21021675::"Denies"} Difficulty using hands for taps, buttons, cutlery, and/or writing: {ACTIONS;DENIES/REPORTS:21021675::"Denies"}  No Rheumatology ROS completed.   PMFS History:  Patient Active Problem List   Diagnosis Date Noted  . DDD (degenerative disc disease), lumbar 02/08/2017  . DDD (degenerative disc disease), cervical 02/08/2017  . Primary osteoarthritis of both hips 02/08/2017  . Bilateral primary osteoarthritis of knee 02/08/2017  . History of renal insufficiency 02/08/2017  . Plantar fasciitis 02/08/2017  . History of bilateral total hip arthroplasty 02/08/2017  . History of total knee replacement 02/08/2017  . History of depression 02/08/2017  . Trapezius muscle spasm 07/18/2016  . Bilateral sacroiliitis (HCC) 07/18/2016  . Rheumatoid arthritis involving multiple sites with positive rheumatoid factor (HCC) 07/17/2016  . High risk medications (not anticoagulants) long-term use 07/17/2016  . Fibromyalgia 07/17/2016  . Fatigue 07/17/2016  . Insomnia 07/17/2016    Past Medical History:  Diagnosis Date  . CRI (chronic renal insufficiency)   . DDD (degenerative disc disease), cervical   . DDD (degenerative disc disease), lumbar   . Depression   .  Fibromyalgia   . OA (osteoarthritis) of knee   . Osteoarthritis of hip   . Plantar fasciitis, bilateral   . Rheumatoid arthritis (HCC)     No family history on file. Past Surgical History:  Procedure Laterality Date  . ANKLE FRACTURE SURGERY    . APPENDECTOMY    . CESAREAN SECTION     x2  . ELBOW SURGERY    . JOINT REPLACEMENT     BIL knee   . JOINT REPLACEMENT     BIL hip  . KNEE ARTHROPLASTY    . NECK SURGERY    . REPLACEMENT TOTAL KNEE Bilateral   . TOTAL HIP ARTHROPLASTY Bilateral    Social History   Social History Narrative  . Not on file   Immunization History  Administered Date(s) Administered  . Influenza,inj,Quad PF,6+ Mos 07/13/2016  . Influenza-Unspecified 07/13/2016  . Pneumococcal Conjugate-13 05/29/2017     Objective: Vital Signs: There were no vitals taken for this visit.   Physical Exam   Musculoskeletal Exam: ***  CDAI Exam: CDAI Score: -- Patient Global: --; Provider Global: -- Swollen: --; Tender: -- Joint Exam 10/30/2019   No joint exam has been documented for this visit   There is currently no information documented on the homunculus. Go to the Rheumatology activity and complete the homunculus joint exam.  Investigation: No additional findings.  Imaging: No results found.  Recent Labs: Lab Results  Component Value Date   WBC 5.3 05/12/2019   HGB 13.8 05/12/2019   PLT 284 05/12/2019   NA 142 05/12/2019   K 4.5 05/12/2019   CL 104 05/12/2019   CO2 25 05/12/2019   GLUCOSE 84 05/12/2019   BUN 18 05/12/2019   CREATININE 1.11 (  H) 05/12/2019   BILITOT 0.4 05/12/2019   ALKPHOS 98 02/16/2017   AST 16 05/12/2019   ALT 14 05/12/2019   PROT 6.3 05/12/2019   ALBUMIN 3.8 02/16/2017   CALCIUM 9.4 05/12/2019   GFRAA 60 05/12/2019    Speciality Comments: PLQ Eye Exam: 05/22/19 WNL @ [facility] Follow up in 1 year Osteoporosis managed by another provider.  Procedures:  No procedures performed Allergies: Patient has no  known allergies.   Assessment / Plan:     Visit Diagnoses: No diagnosis found.  Orders: No orders of the defined types were placed in this encounter.  No orders of the defined types were placed in this encounter.   Face-to-face time spent with patient was *** minutes. Greater than 50% of time was spent in counseling and coordination of care.  Follow-Up Instructions: No follow-ups on file.   Ofilia Neas, PA-C  Note - This record has been created using Dragon software.  Chart creation errors have been sought, but may not always  have been located. Such creation errors do not reflect on  the standard of medical care.

## 2019-10-30 ENCOUNTER — Ambulatory Visit: Payer: Medicare Other | Admitting: Rheumatology

## 2019-11-05 ENCOUNTER — Other Ambulatory Visit: Payer: Self-pay | Admitting: Physician Assistant

## 2019-11-05 DIAGNOSIS — Z79899 Other long term (current) drug therapy: Secondary | ICD-10-CM

## 2019-11-05 NOTE — Telephone Encounter (Signed)
Ok to refill [employer].    Please advise patient to update CBC and CMP ASAP.  Ok to refill 30 day supply of PLQ.

## 2019-11-05 NOTE — Telephone Encounter (Signed)
Patient's husband advised patient is due to update labs. Patient will come Friday to update labs,

## 2019-11-05 NOTE — Telephone Encounter (Signed)
Last Visit: 05/12/19 Next visit: 11/28/19 Labs: 05/12/19 Labs are stable. GFR is low. PLQ Eye Exam: 05/22/19 WNL  Okay to refill PLQ and Ambien?

## 2019-11-07 ENCOUNTER — Other Ambulatory Visit: Payer: Self-pay

## 2019-11-07 DIAGNOSIS — Z79899 Other long term (current) drug therapy: Secondary | ICD-10-CM

## 2019-11-07 LAB — COMPLETE METABOLIC PANEL WITH GFR
AG Ratio: 1.7 (calc) (ref 1.0–2.5)
ALT: 16 U/L (ref 6–29)
AST: 18 U/L (ref 10–35)
Albumin: 3.9 g/dL (ref 3.6–5.1)
Alkaline phosphatase (APISO): 66 U/L (ref 37–153)
BUN/Creatinine Ratio: 19 (calc) (ref 6–22)
BUN: 20 mg/dL (ref 7–25)
CO2: 26 mmol/L (ref 20–32)
Calcium: 9.1 mg/dL (ref 8.6–10.4)
Chloride: 105 mmol/L (ref 98–110)
Creat: 1.03 mg/dL — ABNORMAL HIGH (ref 0.50–0.99)
GFR, Est African American: 65 mL/min/{1.73_m2} (ref >=60)
GFR, Est Non African American: 56 mL/min/{1.73_m2} — ABNORMAL LOW (ref >=60)
Globulin: 2.3 g/dL (calc) (ref 1.9–3.7)
Glucose, Bld: 145 mg/dL — ABNORMAL HIGH (ref 65–99)
Potassium: 4.3 mmol/L (ref 3.5–5.3)
Sodium: 140 mmol/L (ref 135–146)
Total Bilirubin: 0.3 mg/dL (ref 0.2–1.2)
Total Protein: 6.2 g/dL (ref 6.1–8.1)

## 2019-11-07 LAB — CBC WITH DIFFERENTIAL/PLATELET
Absolute Monocytes: 400 cells/uL (ref 200–950)
Basophils Absolute: 30 cells/uL (ref 0–200)
Basophils Relative: 0.6 %
Eosinophils Absolute: 70 cells/uL (ref 15–500)
Eosinophils Relative: 1.4 %
HCT: 39.1 % (ref 35.0–45.0)
Hemoglobin: 12.8 g/dL (ref 11.7–15.5)
Lymphs Abs: 1375 cells/uL (ref 850–3900)
MCH: 31.3 pg (ref 27.0–33.0)
MCHC: 32.7 g/dL (ref 32.0–36.0)
MCV: 95.6 fL (ref 80.0–100.0)
MPV: 9.7 fL (ref 7.5–12.5)
Monocytes Relative: 8 %
Neutro Abs: 3125 cells/uL (ref 1500–7800)
Neutrophils Relative %: 62.5 %
Platelets: 249 10*3/uL (ref 140–400)
RBC: 4.09 10*6/uL (ref 3.80–5.10)
RDW: 12.7 % (ref 11.0–15.0)
Total Lymphocyte: 27.5 %
WBC: 5 10*3/uL (ref 3.8–10.8)

## 2019-11-10 NOTE — Progress Notes (Signed)
Glucose is elevated probably not fasting she should monitor her sugar intake.  Please forward labs to her PCP.  Creatinine is mildly elevated but stable.  CBC is normal.

## 2019-11-12 ENCOUNTER — Telehealth: Payer: Self-pay

## 2019-11-12 NOTE — Telephone Encounter (Signed)
Patient's husband called in regards to lab results. I advised him and patient Glucose is elevated probably not fasting she should monitor her sugar intake. Creatinine is mildly elevated but stable. CBC is normal. They both verbalized understanding.

## 2019-11-24 NOTE — Progress Notes (Signed)
Office Visit Note  Patient: Tammie [name]             Date of Birth: 03-Jun-1951           MRN: [id number]             PCP: Amelia Jo, FNP Referring: Amelia Jo, FNP Visit Date: 11/28/2019 Occupation: @GUAROCC @  Subjective:  Chronic neck and lower back pain   History of Present Illness: Tammie [name] is a 69 y.o. female with history of seropositive rheumatoid arthritis, DDD, and fibromyalgia.  She is taking plaquenil 200 mg 1 tablet by mouth twice daily M-F. She denies any recent rheumatoid arthritis flares.  She is having pain in the left shoulder joint and is unable to lift her left arm.  She has chronic pain in both hands.  She denies any joint swelling. She continues to have chronic pain in her neck and lower back.  She continues to have generalized muscle aches or muscle tenderness due to fibromyalgia.    Activities of Daily Living:  Patient reports joint stiffness all day Patient Reports nocturnal pain.  Difficulty dressing/grooming: Reports Difficulty climbing stairs: Reports Difficulty getting out of chair: Reports Difficulty using hands for taps, buttons, cutlery, and/or writing: Reports  Review of Systems  Constitutional: Positive for fatigue.  HENT: Positive for mouth dryness. Negative for mouth sores and nose dryness.   Eyes: Negative for pain, itching, visual disturbance and dryness.  Respiratory: Negative for cough, hemoptysis, shortness of breath, wheezing and difficulty breathing.   Cardiovascular: Negative for chest pain, palpitations, hypertension and swelling in legs/feet.  Gastrointestinal: Negative for blood in stool, constipation and diarrhea.  Endocrine: Negative for increased urination.  Genitourinary: Negative for difficulty urinating and painful urination.  Musculoskeletal: Positive for arthralgias, joint pain, joint swelling and morning stiffness. Negative for myalgias, muscle weakness, muscle tenderness and myalgias.  Skin:  Negative for color change, pallor, rash, hair loss, nodules/bumps, skin tightness, ulcers and sensitivity to sunlight.  Allergic/Immunologic: Negative for susceptible to infections.  Neurological: Negative for dizziness, numbness, headaches, memory loss and weakness.  Hematological: Negative for swollen glands.  Psychiatric/Behavioral: Negative for depressed mood, confusion and sleep disturbance. The patient is not nervous/anxious.     PMFS History:  Patient Active Problem List   Diagnosis Date Noted  . DDD (degenerative disc disease), lumbar 02/08/2017  . DDD (degenerative disc disease), cervical 02/08/2017  . Primary osteoarthritis of both hips 02/08/2017  . Bilateral primary osteoarthritis of knee 02/08/2017  . History of renal insufficiency 02/08/2017  . Plantar fasciitis 02/08/2017  . History of bilateral total hip arthroplasty 02/08/2017  . History of total knee replacement 02/08/2017  . History of depression 02/08/2017  . Trapezius muscle spasm 07/18/2016  . Bilateral sacroiliitis (HCC) 07/18/2016  . Rheumatoid arthritis involving multiple sites with positive rheumatoid factor (HCC) 07/17/2016  . High risk medications (not anticoagulants) long-term use 07/17/2016  . Fibromyalgia 07/17/2016  . Fatigue 07/17/2016  . Insomnia 07/17/2016    Past Medical History:  Diagnosis Date  . CRI (chronic renal insufficiency)   . DDD (degenerative disc disease), cervical   . DDD (degenerative disc disease), lumbar   . Depression   . Fibromyalgia   . OA (osteoarthritis) of knee   . Osteoarthritis of hip   . Plantar fasciitis, bilateral   . Rheumatoid arthritis (HCC)     History reviewed. No pertinent family history. Past Surgical History:  Procedure Laterality Date  . ANKLE FRACTURE SURGERY    .  APPENDECTOMY    . CESAREAN SECTION     x2  . ELBOW SURGERY    . JOINT REPLACEMENT     BIL knee   . JOINT REPLACEMENT     BIL hip  . KNEE ARTHROPLASTY    . NECK SURGERY    .  REPLACEMENT TOTAL KNEE Bilateral   . TOTAL HIP ARTHROPLASTY Bilateral    Social History   Social History Narrative  . Not on file   Immunization History  Administered Date(s) Administered  . Influenza,inj,Quad PF,6+ Mos 07/13/2016  . Influenza-Unspecified 07/13/2016  . Pneumococcal Conjugate-13 05/29/2017     Objective: Vital Signs: BP (!) 149/92 (BP Location: Right Arm, Patient Position: Standing, Cuff Size: Normal)   Pulse 93   Resp 14   Ht 5\' 6"  (1.676 m)   Wt 165 lb (74.8 kg)   BMI 26.63 kg/m    Physical Exam Vitals and nursing note reviewed.  Constitutional:      Appearance: She is well-developed.  HENT:     Head: Normocephalic and atraumatic.  Eyes:     Conjunctiva/sclera: Conjunctivae normal.  Pulmonary:     Effort: Pulmonary effort is normal.  Abdominal:     General: Bowel sounds are normal.     Palpations: Abdomen is soft.  Musculoskeletal:     Cervical back: Normal range of motion.  Lymphadenopathy:     Cervical: No cervical adenopathy.  Skin:    General: Skin is warm and dry.     Capillary Refill: Capillary refill takes less than 2 seconds.  Neurological:     Mental Status: She is alert and oriented to person, place, and time.  Psychiatric:        Behavior: Behavior normal.      Musculoskeletal Exam: Generalized hyperalgesia and positive tender points. C-spine limited ROM.  Painful and limited ROM of lumbar spine. Right shoulder full ROM.  Left shoulder full passive ROM.  Painful and limited active ROM of left shoulder.  Elbow joints and wrist joints good ROM with no synovitis. Left 2nd MCP joint synovitis. Right 2nd MCP synovial thickening. PIP and DIP thickening consistent with osteoarthritis. Hip replacements have painful ROM. Knee replacements good ROM with no warmth or effusion.  Ankle joints good ROM with no tenderness or synovitis.   CDAI Exam: CDAI Score: -- Patient Global: --; Provider Global: -- Swollen: --; Tender: -- Joint Exam  11/28/2019   No joint exam has been documented for this visit   There is currently no information documented on the homunculus. Go to the Rheumatology activity and complete the homunculus joint exam.  Investigation: No additional findings.  Imaging: No results found.  Recent Labs: Lab Results  Component Value Date   WBC 5.0 11/07/2019   HGB 12.8 11/07/2019   PLT 249 11/07/2019   NA 140 11/07/2019   K 4.3 11/07/2019   CL 105 11/07/2019   CO2 26 11/07/2019   GLUCOSE 145 (H) 11/07/2019   BUN 20 11/07/2019   CREATININE 1.03 (H) 11/07/2019   BILITOT 0.3 11/07/2019   ALKPHOS 98 02/16/2017   AST 18 11/07/2019   ALT 16 11/07/2019   PROT 6.2 11/07/2019   ALBUMIN 3.8 02/16/2017   CALCIUM 9.1 11/07/2019   GFRAA 65 11/07/2019    Speciality Comments: PLQ Eye Exam: 05/22/19 WNL @ [facility] Follow up in 1 year Osteoporosis managed by another provider.  Procedures:  Large Joint Inj: L glenohumeral on 11/28/2019 2:42 PM Indications: pain Details: 27 G 1.5 in  needle, posterior approach  Arthrogram: No  Medications: 1.5 mL lidocaine 1 %; 40 mg triamcinolone acetonide 40 MG/ML Aspirate: 0 mL Outcome: tolerated well, no immediate complications Procedure, treatment alternatives, risks and benefits explained, specific risks discussed. Consent was given by the patient. Immediately prior to procedure a time out was called to verify the correct patient, procedure, equipment, support staff and site/side marked as required. Patient was prepped and draped in the usual sterile fashion.     Allergies: Patient has no known allergies.     Assessment / Plan:     Visit Diagnoses: Rheumatoid arthritis involving multiple sites with positive rheumatoid factor (HCC): She has no synovitis on exam.  She has not had any recent rheumatoid arthritis flares.  She is clinically doing well on plaquenil 200 mg 1 tablet by mouth twice daily M-F.  She presents today with painful and limited active ROM  of the left shoulder joint.  X-rays of the left shoulder were unremarkable on 05/12/19.  She requested a left shoulder joint cortisone injection today.  Procedure note completed above.  She continues to have generalized hyperalgesia due to fibromyalgia, but overall her rheumatoid arthritis seems well controlled. She will continue taking plaquenil as prescribed.  She does not need any refills at this time. She was advised to notify us if she develops increased joint pain or joint swelling.  She will follow up in 5 months.   High risk medications (not anticoagulants) long-term use - Plaquenil 200 mg twice daily Monday through Friday only. CBC and CMP were stable on 11/07/19.  PLQ Eye Exam: 05/22/19 WNL @ [facility] Follow up in 1 year.   Chronic left shoulder pain: She presents today with increased left shoulder joint pain. She has painful and limited active ROM of the left shoulder.  Full passive ROM.  X-rays of the left shoulder were unremarkable on 05/12/19. She requested a left shoulder joint cortisone injection today.  She tolerated the procedure.  Procedure note completed above.  We discussed the importance of monitoring her blood pressure closely following the cortisone injection.   History of bilateral total hip arthroplasty: Chronic pain.  She has limited and painful ROM.    History of total bilateral knee replacement: She has good ROM with discomfort bilaterally.  No warmth or effusion noted.   DDD (degenerative disc disease), cervical: Chronic pain. She has limited ROM with discomfort.  She has trapezius muscle tension and tenderness bilaterally. She will be referred to pain management.   DDD (degenerative disc disease), lumbar: Chronic pain.  She has difficulty standing for a prolonged period of time. She will be referred to pain management.   Fibromyalgia: She has generalized hyperalgesia and positive tender points on exam. She takes tramadol 50 mg 1 tablet by mouth every 8 hours as  needed for pain relief.  She continues to have generalized myalgias and muscle tenderness.  She would like a referral to pain management.   Other fatigue: Chronic   Primary insomnia: She takes ambien 10 mg 1 tablet by mouth at bedtime for insomnia.  Good sleep hygiene was discussed.   Age-related osteoporosis without current pathological fracture: She is taking fosamax 70 mg 1 tablet by mouth once weekly and vitamin D daily.  Her PCP orders her bone densities.   Other medical conditions are listed as follows:   History of depression  History of renal insufficiency  Orders: Orders Placed This Encounter  Procedures  . Large Joint Inj  . Ambulatory referral  to Pain Clinic   No orders of the defined types were placed in this encounter.     Follow-Up Instructions: Return in about 5 months (around 04/29/2020) for Rheumatoid arthritis, Fibromyalgia, DDD.  Tammie [name]  I examined and evaluated the patient with Tammie Sams PA.  Patient continues to have generalized pain and discomfort.  She was having lot of discomfort in her left shoulder joint.  Her left shoulder appears to be frozen with limited range of motion.  Per her request after informed consent was obtained left shoulder joint was injected with cortisone as described above.  She tolerated the procedure well.  Post procedure precautions were discussed.  She was also advised to contact us in case she has persistent symptoms.  The plan of care was discussed as noted above.  Bo Merino, MD  Note - This record has been created using Editor, commissioning.  Chart creation errors have been sought, but may not always  have been located. Such creation errors do not reflect on  the standard of medical care.

## 2019-11-28 ENCOUNTER — Other Ambulatory Visit: Payer: Self-pay

## 2019-11-28 ENCOUNTER — Ambulatory Visit: Payer: Medicare Other | Admitting: Rheumatology

## 2019-11-28 ENCOUNTER — Encounter: Payer: Self-pay | Admitting: Rheumatology

## 2019-11-28 VITALS — BP 149/92 | HR 93 | Resp 14 | Ht 66.0 in | Wt 165.0 lb

## 2019-11-28 DIAGNOSIS — Z96643 Presence of artificial hip joint, bilateral: Secondary | ICD-10-CM | POA: Diagnosis not present

## 2019-11-28 DIAGNOSIS — M0579 Rheumatoid arthritis with rheumatoid factor of multiple sites without organ or systems involvement: Secondary | ICD-10-CM | POA: Diagnosis not present

## 2019-11-28 DIAGNOSIS — Z96653 Presence of artificial knee joint, bilateral: Secondary | ICD-10-CM

## 2019-11-28 DIAGNOSIS — Z8659 Personal history of other mental and behavioral disorders: Secondary | ICD-10-CM

## 2019-11-28 DIAGNOSIS — F5101 Primary insomnia: Secondary | ICD-10-CM

## 2019-11-28 DIAGNOSIS — M25512 Pain in left shoulder: Secondary | ICD-10-CM

## 2019-11-28 DIAGNOSIS — Z79899 Other long term (current) drug therapy: Secondary | ICD-10-CM | POA: Diagnosis not present

## 2019-11-28 DIAGNOSIS — M51369 Other intervertebral disc degeneration, lumbar region without mention of lumbar back pain or lower extremity pain: Secondary | ICD-10-CM

## 2019-11-28 DIAGNOSIS — M797 Fibromyalgia: Secondary | ICD-10-CM

## 2019-11-28 DIAGNOSIS — M5136 Other intervertebral disc degeneration, lumbar region: Secondary | ICD-10-CM

## 2019-11-28 DIAGNOSIS — R5383 Other fatigue: Secondary | ICD-10-CM

## 2019-11-28 DIAGNOSIS — M810 Age-related osteoporosis without current pathological fracture: Secondary | ICD-10-CM

## 2019-11-28 DIAGNOSIS — M5030 Other cervical disc degeneration, unspecified cervical region: Secondary | ICD-10-CM

## 2019-11-28 DIAGNOSIS — G8929 Other chronic pain: Secondary | ICD-10-CM

## 2019-11-28 DIAGNOSIS — Z87448 Personal history of other diseases of urinary system: Secondary | ICD-10-CM

## 2019-12-04 ENCOUNTER — Encounter: Payer: Self-pay | Admitting: Physical Medicine and Rehabilitation

## 2019-12-12 ENCOUNTER — Encounter: Payer: Medicare Other | Admitting: Physical Medicine and Rehabilitation

## 2020-04-16 NOTE — Progress Notes (Signed)
Office Visit Note  Patient: Tammie [name]             Date of Birth: 13-Jan-1951           MRN: [id number]             PCP: Amelia Jo, FNP Referring: Amelia Jo, FNP Visit Date: 04/29/2020 Occupation: @GUAROCC @  Subjective:  Pain in multiple joints.   History of Present Illness: Tammie [name] is a 69 y.o. female history of rheumatoid arthritis, osteoarthritis and degenerative disc disease.  She states she continues to have pain and discomfort from fibromyalgia.  She also has discomfort in her neck and lower back.  She states she is waiting to get into pain management to get some relief.  She states she tries to exercise on daily basis and the next day she has a lot of discomfort.  She denies any joint swelling.  Activities of Daily Living:  Patient reports morning stiffness for 4 hours.   Patient Reports nocturnal pain.  Difficulty dressing/grooming: Denies Difficulty climbing stairs: Reports Difficulty getting out of chair: Reports Difficulty using hands for taps, buttons, cutlery, and/or writing: Reports  Review of Systems  Constitutional: Negative for fatigue.  HENT: Positive for hearing loss. Negative for mouth sores, mouth dryness and nose dryness.   Eyes: Negative for itching and dryness.  Respiratory: Negative for shortness of breath and difficulty breathing.   Cardiovascular: Negative for chest pain and palpitations.  Gastrointestinal: Positive for constipation. Negative for blood in stool and diarrhea.  Endocrine: Negative for increased urination.  Genitourinary: Negative for difficulty urinating.  Musculoskeletal: Positive for arthralgias, joint pain, myalgias, morning stiffness, muscle tenderness and myalgias. Negative for joint swelling.  Skin: Positive for rash. Negative for color change.  Allergic/Immunologic: Negative for susceptible to infections.  Neurological: Positive for numbness. Negative for dizziness, headaches, memory loss  and weakness.  Hematological: Negative for bruising/bleeding tendency.  Psychiatric/Behavioral: Negative for confusion.    PMFS History:  Patient Active Problem List   Diagnosis Date Noted  . DDD (degenerative disc disease), lumbar 02/08/2017  . DDD (degenerative disc disease), cervical 02/08/2017  . Primary osteoarthritis of both hips 02/08/2017  . Bilateral primary osteoarthritis of knee 02/08/2017  . History of renal insufficiency 02/08/2017  . Plantar fasciitis 02/08/2017  . History of bilateral total hip arthroplasty 02/08/2017  . History of total knee replacement 02/08/2017  . History of depression 02/08/2017  . Trapezius muscle spasm 07/18/2016  . Bilateral sacroiliitis (HCC) 07/18/2016  . Rheumatoid arthritis involving multiple sites with positive rheumatoid factor (HCC) 07/17/2016  . High risk medications (not anticoagulants) long-term use 07/17/2016  . Fibromyalgia 07/17/2016  . Fatigue 07/17/2016  . Insomnia 07/17/2016    Past Medical History:  Diagnosis Date  . CRI (chronic renal insufficiency)   . DDD (degenerative disc disease), cervical   . DDD (degenerative disc disease), lumbar   . Depression   . Fibromyalgia   . OA (osteoarthritis) of knee   . Osteoarthritis of hip   . Plantar fasciitis, bilateral   . Rheumatoid arthritis (HCC)     History reviewed. No pertinent family history. Past Surgical History:  Procedure Laterality Date  . ANKLE FRACTURE SURGERY    . APPENDECTOMY    . CESAREAN SECTION     x2  . ELBOW SURGERY    . JOINT REPLACEMENT     BIL knee   . JOINT REPLACEMENT     BIL hip  . KNEE ARTHROPLASTY    .  NECK SURGERY    . REPLACEMENT TOTAL KNEE Bilateral   . TOTAL HIP ARTHROPLASTY Bilateral    Social History   Social History Narrative  . Not on file   Immunization History  Administered Date(s) Administered  . Influenza,inj,Quad PF,6+ Mos 07/13/2016  . Influenza-Unspecified 07/13/2016  . Pneumococcal Conjugate-13 05/29/2017      Objective: Vital Signs: BP 133/85 (BP Location: Left Arm, Patient Position: Sitting, Cuff Size: Normal)   Pulse 88   Resp 15   Ht 5\' 5"  (1.651 m)   Wt 162 lb 9.6 oz (73.8 kg)   BMI 27.06 kg/m    Physical Exam Vitals and nursing note reviewed.  Constitutional:      Appearance: She is well-developed.  HENT:     Head: Normocephalic and atraumatic.  Eyes:     Conjunctiva/sclera: Conjunctivae normal.  Cardiovascular:     Rate and Rhythm: Normal rate and regular rhythm.     Heart sounds: Normal heart sounds.  Pulmonary:     Effort: Pulmonary effort is normal.     Breath sounds: Normal breath sounds.  Abdominal:     General: Bowel sounds are normal.     Palpations: Abdomen is soft.  Musculoskeletal:     Cervical back: Normal range of motion.  Lymphadenopathy:     Cervical: No cervical adenopathy.  Skin:    General: Skin is warm and dry.     Capillary Refill: Capillary refill takes less than 2 seconds.  Neurological:     Mental Status: She is alert and oriented to person, place, and time.  Psychiatric:        Behavior: Behavior normal.      Musculoskeletal Exam: Pain-free range of motion of her cervical and lumbar spine.  Shoulder joints, elbow joints, wrist joints with good range of motion.  She has bilateral CMC PIP and DIP thickening but no synovitis was noted.  She has limited range of motion of her hip joints.  She had bilateral total hip replacement and bilateral total knee replacement.  No MTP tenderness was noted.  CDAI Exam: CDAI Score: -- Patient Global: --; Provider Global: -- Swollen: --; Tender: -- Joint Exam 04/29/2020   No joint exam has been documented for this visit   There is currently no information documented on the homunculus. Go to the Rheumatology activity and complete the homunculus joint exam.  Investigation: No additional findings.  Imaging: XR Foot 2 Views Left  Result Date: 04/29/2020 First MTP, PIP and DIP narrowing was noted.  No  intertarsal tibiotalar joint space narrowing was noted.  Inferior calcaneal spur was noted. Impression: These findings are consistent with osteoarthritis of the foot.  XR Foot 2 Views Right  Result Date: 04/29/2020 First MTP, PIP and DIP narrowing was noted.  No intertarsal tibiotalar joint space narrowing was noted.  Inferior calcaneal spur was noted. Impression: These findings are consistent with osteoarthritis of the foot.  XR Hand 2 View Left  Result Date: 04/29/2020 Juxta-articular osteopenia was noted.  PIP DIP and CMC narrowing was noted.  No intercarpal radiocarpal joint space narrowing was noted.  No erosive changes were noted. Impression: These findings are consistent with rheumatoid arthritis and osteoarthritis overlap.  XR Hand 2 View Right  Result Date: 04/29/2020 Juxta-articular osteopenia was noted.  PIP DIP and CMC narrowing was noted.  No intercarpal radiocarpal joint space narrowing was noted.  No erosive changes were noted. Impression: These findings are consistent with rheumatoid arthritis and osteoarthritis overlap.   Recent Labs: Lab  Results  Component Value Date   WBC 5.0 11/07/2019   HGB 12.8 11/07/2019   PLT 249 11/07/2019   NA 140 11/07/2019   K 4.3 11/07/2019   CL 105 11/07/2019   CO2 26 11/07/2019   GLUCOSE 145 (H) 11/07/2019   BUN 20 11/07/2019   CREATININE 1.03 (H) 11/07/2019   BILITOT 0.3 11/07/2019   ALKPHOS 98 02/16/2017   AST 18 11/07/2019   ALT 16 11/07/2019   PROT 6.2 11/07/2019   ALBUMIN 3.8 02/16/2017   CALCIUM 9.1 11/07/2019   GFRAA 65 11/07/2019    Speciality Comments: PLQ Eye Exam: 05/22/19 WNL @ [facility] Follow up in 1 year Osteoporosis managed by another provider.  Procedures:  No procedures performed Allergies: Patient has no known allergies.   Assessment / Plan:     Visit Diagnoses: Rheumatoid arthritis involving multiple sites with positive rheumatoid factor (HCC) -she has been doing well as regards to her rheumatoid  arthritis.  She has ongoing pain and discomfort which from osteoarthritis and fibromyalgia.  She had no synovitis on examination.  We obtained x-rays to look for radiographic progression.  Plan: XR Hand 2 View Right, XR Hand 2 View Left, XR Foot 2 Views Right, XR Foot 2 Views Left.  X-rays were consistent with osteoarthritis and rheumatoid arthritis overlap.  No erosive changes were noted.  High risk medications (not anticoagulants) long-term use - Plaquenil 200 mg twice daily Monday through Friday only. PLQ Eye Exam: 05/22/19  - Plan: CBC with Differential/Platelet, COMPLETE METABOLIC PANEL WITH GFR today and then every 5 months to monitor for drug toxicity.  She also needs repeat eye examination.  Chronic left shoulder pain-she had good range of motion today.  History of bilateral total hip arthroplasty-she has limited range of motion some discomfort.  History of total bilateral knee replacement-she had good range of motion without much discomfort.  DDD (degenerative disc disease), cervical-she has ongoing pain in her cervical spine.  DDD (degenerative disc disease), lumbar - referred to pain management.   Fibromyalgia -she continues to have ongoing pain.  She will be going to pain management.  Need for regular exercise and stretching was discussed.  Primary insomnia - ambien 10 mg 1 tablet by mouth at bedtime for insomnia.  Other fatigue-due to insomnia and fibromyalgia.  Age-related osteoporosis without current pathological fracture - fosamax 70 mg 1 tablet by mouth once weekly and vitamin D daily.  Her PCP orders her bone densities.   History of renal insufficiency  History of depression  Orders: Orders Placed This Encounter  Procedures  . XR Hand 2 View Right  . XR Hand 2 View Left  . XR Foot 2 Views Right  . XR Foot 2 Views Left  . CBC with Differential/Platelet  . COMPLETE METABOLIC PANEL WITH GFR   No orders of the defined types were placed in this  encounter.     Follow-Up Instructions: Return in about 5 months (around 09/29/2020) for Rheumatoid arthritis, Osteoarthritis.   Pollyann Savoy, MD  Note - This record has been created using Animal nutritionist.  Chart creation errors have been sought, but may not always  have been located. Such creation errors do not reflect on  the standard of medical care.

## 2020-04-28 ENCOUNTER — Other Ambulatory Visit: Payer: Self-pay | Admitting: Physician Assistant

## 2020-04-29 ENCOUNTER — Ambulatory Visit: Payer: Self-pay

## 2020-04-29 ENCOUNTER — Encounter: Payer: Self-pay | Admitting: Rheumatology

## 2020-04-29 ENCOUNTER — Other Ambulatory Visit: Payer: Self-pay

## 2020-04-29 ENCOUNTER — Ambulatory Visit (INDEPENDENT_AMBULATORY_CARE_PROVIDER_SITE_OTHER): Payer: Medicare Other | Admitting: Rheumatology

## 2020-04-29 VITALS — BP 133/85 | HR 88 | Resp 15 | Ht 65.0 in | Wt 162.6 lb

## 2020-04-29 DIAGNOSIS — M79642 Pain in left hand: Secondary | ICD-10-CM

## 2020-04-29 DIAGNOSIS — F5101 Primary insomnia: Secondary | ICD-10-CM

## 2020-04-29 DIAGNOSIS — Z96653 Presence of artificial knee joint, bilateral: Secondary | ICD-10-CM

## 2020-04-29 DIAGNOSIS — Z96643 Presence of artificial hip joint, bilateral: Secondary | ICD-10-CM | POA: Diagnosis not present

## 2020-04-29 DIAGNOSIS — M79641 Pain in right hand: Secondary | ICD-10-CM

## 2020-04-29 DIAGNOSIS — M79672 Pain in left foot: Secondary | ICD-10-CM

## 2020-04-29 DIAGNOSIS — M25512 Pain in left shoulder: Secondary | ICD-10-CM

## 2020-04-29 DIAGNOSIS — M79671 Pain in right foot: Secondary | ICD-10-CM

## 2020-04-29 DIAGNOSIS — M810 Age-related osteoporosis without current pathological fracture: Secondary | ICD-10-CM

## 2020-04-29 DIAGNOSIS — M797 Fibromyalgia: Secondary | ICD-10-CM

## 2020-04-29 DIAGNOSIS — R5383 Other fatigue: Secondary | ICD-10-CM

## 2020-04-29 DIAGNOSIS — G8929 Other chronic pain: Secondary | ICD-10-CM

## 2020-04-29 DIAGNOSIS — Z79899 Other long term (current) drug therapy: Secondary | ICD-10-CM | POA: Diagnosis not present

## 2020-04-29 DIAGNOSIS — M5030 Other cervical disc degeneration, unspecified cervical region: Secondary | ICD-10-CM

## 2020-04-29 DIAGNOSIS — M5136 Other intervertebral disc degeneration, lumbar region: Secondary | ICD-10-CM

## 2020-04-29 DIAGNOSIS — Z87448 Personal history of other diseases of urinary system: Secondary | ICD-10-CM

## 2020-04-29 DIAGNOSIS — M0579 Rheumatoid arthritis with rheumatoid factor of multiple sites without organ or systems involvement: Secondary | ICD-10-CM

## 2020-04-29 DIAGNOSIS — Z8659 Personal history of other mental and behavioral disorders: Secondary | ICD-10-CM

## 2020-04-29 NOTE — Telephone Encounter (Signed)
Last Visit: 11/28/2019 Next Visit: 04/29/2020 Labs: 11/07/2019 Glucose is elevated probably not fasting. Creatinine is mildly elevated but stable. CBC is normal. Eye exam: 05/22/19 WNL   Current Dose per office note 11/28/2019: plaquenil 200 mg 1 tablet by mouth twice daily M-F DX: Rheumatoid arthritis involving multiple sites with positive rheumatoid factor   Okay to refill Plaquenil?

## 2020-04-29 NOTE — Patient Instructions (Signed)
   COVID-19 vaccine recommendations:   COVID-19 vaccine is recommended for everyone (unless you are allergic to a vaccine component), even if you are on a medication that suppresses your immune system.   If you are on Methotrexate, Cellcept (mycophenolate), Rinvoq, Xeljanz, and Olumiant- hold the medication for 1 week after each vaccine. Hold Methotrexate for 2 weeks after the single dose COVID-19 vaccine.   If you are on Orencia subcutaneous injection - hold medication one week prior to and one week after the first COVID-19 vaccine dose (only).   If you are on Orencia IV infusions- time vaccination administration so that the first COVID-19 vaccination will occur four weeks after the infusion and postpone the subsequent infusion by one week.   If you are on Cyclophosphamide or Rituxan infusions please contact your doctor prior to receiving the COVID-19 vaccine.   Do not take Tylenol or ant anti-inflammatory medications (NSAIDs) 24 hours prior to the COVID-19 vaccination.   There is no direct evidence about the efficacy of the COVID-19 vaccine in individuals who are on medications that suppress the immune system.   Even if you are fully vaccinated, and you are on any medications that suppress your immune system, please continue to wear a mask, maintain at least six feet social distance and practice hand hygiene.   If you develop a COVID-19 infection, please contact your PCP or our office to determine if you need antibody infusion.  We anticipate that a booster vaccine will be available soon for immunosuppressed individuals. Please cal our office before receiving your booster dose to make adjustments to your medication regimen.  

## 2020-05-06 LAB — COMPLETE METABOLIC PANEL WITH GFR
AG Ratio: 1.9 (calc) (ref 1.0–2.5)
ALT: 24 U/L (ref 6–29)
AST: 28 U/L (ref 10–35)
Albumin: 4 g/dL (ref 3.6–5.1)
Alkaline phosphatase (APISO): 64 U/L (ref 37–153)
BUN/Creatinine Ratio: 13 (calc) (ref 6–22)
BUN: 14 mg/dL (ref 7–25)
CO2: 25 mmol/L (ref 20–32)
Calcium: 9.2 mg/dL (ref 8.6–10.4)
Chloride: 107 mmol/L (ref 98–110)
Creat: 1.05 mg/dL — ABNORMAL HIGH (ref 0.50–0.99)
GFR, Est African American: 63 mL/min/{1.73_m2} (ref >=60)
GFR, Est Non African American: 55 mL/min/{1.73_m2} — ABNORMAL LOW (ref >=60)
Globulin: 2.1 g/dL (calc) (ref 1.9–3.7)
Glucose, Bld: 98 mg/dL (ref 65–139)
Potassium: 4.4 mmol/L (ref 3.5–5.3)
Sodium: 141 mmol/L (ref 135–146)
Total Bilirubin: 0.2 mg/dL (ref 0.2–1.2)
Total Protein: 6.1 g/dL (ref 6.1–8.1)

## 2020-05-06 LAB — CBC WITH DIFFERENTIAL/PLATELET
Absolute Monocytes: 418 cells/uL (ref 200–950)
Basophils Absolute: 31 cells/uL (ref 0–200)
Basophils Relative: 0.7 %
Eosinophils Absolute: 70 cells/uL (ref 15–500)
Eosinophils Relative: 1.6 %
HCT: 38.9 % (ref 35.0–45.0)
Hemoglobin: 12.7 g/dL (ref 11.7–15.5)
Lymphs Abs: 1316 cells/uL (ref 850–3900)
MCH: 31.4 pg (ref 27.0–33.0)
MCHC: 32.6 g/dL (ref 32.0–36.0)
MCV: 96.3 fL (ref 80.0–100.0)
MPV: 9.4 fL (ref 7.5–12.5)
Monocytes Relative: 9.5 %
Neutro Abs: 2565 cells/uL (ref 1500–7800)
Neutrophils Relative %: 58.3 %
Platelets: 249 10*3/uL (ref 140–400)
RBC: 4.04 10*6/uL (ref 3.80–5.10)
RDW: 12.6 % (ref 11.0–15.0)
Total Lymphocyte: 29.9 %
WBC: 4.4 10*3/uL (ref 3.8–10.8)

## 2020-05-06 NOTE — Progress Notes (Signed)
CBC is normal.  GFR is low but is stable.  Please forward labs to her PCP.

## 2020-05-10 ENCOUNTER — Encounter
Payer: Medicare Other | Attending: Physical Medicine and Rehabilitation | Admitting: Physical Medicine and Rehabilitation

## 2020-05-24 ENCOUNTER — Other Ambulatory Visit: Payer: Self-pay | Admitting: Rheumatology

## 2020-05-24 NOTE — Telephone Encounter (Signed)
Last visit: 04/29/2020 Next Visit: 09/29/2020 Labs: 05/05/2020 CBC is normal. GFR is low but is stable.  PLQ Eye Exam: 05/22/19 WNL   Current Dose per office note on 04/29/2020:  Plaquenil 200 mg twice daily Monday through Friday only.  Dx: Rheumatoid arthritis involving multiple sites with positive rheumatoid factor   Okay to refill PLQ?

## 2020-06-04 ENCOUNTER — Encounter: Payer: Self-pay | Admitting: Rheumatology

## 2020-06-13 ENCOUNTER — Other Ambulatory Visit: Payer: Self-pay | Admitting: Physician Assistant

## 2020-06-14 ENCOUNTER — Telehealth: Payer: Self-pay | Admitting: Rheumatology

## 2020-06-14 NOTE — Telephone Encounter (Signed)
Spoke with [facility] and they need supervising doctor's information. Advised them of Dr. Fatima Sanger information.

## 2020-06-14 NOTE — Telephone Encounter (Signed)
[facility] left a voicemail requesting a return call within the next 24 hours and utilize reference number [id number].  Phone #[phone]

## 2020-06-14 NOTE — Telephone Encounter (Signed)
Last visit: 04/29/2020 Next Visit: 09/29/2020 Labs: 05/05/2020 CBC is normal. GFR is low but is stable.  PLQ Eye Exam: 06/04/2020 WNL  Current Dose per office note on 04/29/2020: Plaquenil 200 mg twice daily Monday through Friday only.  Dx: Rheumatoid arthritis involving multiple sites with positive rheumatoid factor   Okay to refill PLQ?

## 2020-08-11 ENCOUNTER — Other Ambulatory Visit: Payer: Self-pay | Admitting: Physician Assistant

## 2020-09-15 NOTE — Progress Notes (Signed)
Office Visit Note  Patient: Tammie [name]             Date of Birth: [birth year]           MRN: [id number]             PCP: Amelia Jo, FNP Referring: Amelia Jo, FNP Visit Date: 09/29/2020 Occupation: @GUAROCC @  Subjective:  Medication Management   History of Present Illness: Tammie [name] is a 69 y.o. female with history of seropositive rheumatoid arthritis, fibromyalgia, degenerative disc disease and osteoarthritis.  She states she continues to have pain and discomfort in almost all of her joints.  She has stiffness in the morning.  She has difficulty doing routine activities.  She denies any joint swelling.  She states she has been taking hydroxychloroquine on a regular basis.  The Ambien was not effective when she discontinued the medication.  She is not taking Fosamax in a long time.  She states she has been taking only vitamins and Plaquenil.  She suffers from chronic insomnia.  Activities of Daily Living:  Patient reports morning stiffness for 10 minutes.   Patient Reports nocturnal pain.  Difficulty dressing/grooming: Denies Difficulty climbing stairs: Reports Difficulty getting out of chair: Reports Difficulty using hands for taps, buttons, cutlery, and/or writing: Reports  Review of Systems  Constitutional: Negative for fatigue.  HENT: Negative for mouth sores, mouth dryness and nose dryness.   Eyes: Negative for pain, itching and dryness.  Respiratory: Negative for shortness of breath and difficulty breathing.   Cardiovascular: Negative for chest pain and palpitations.  Gastrointestinal: Negative for blood in stool, constipation and diarrhea.  Endocrine: Negative for increased urination.  Genitourinary: Negative for difficulty urinating.  Musculoskeletal: Positive for arthralgias, joint pain, myalgias, muscle weakness, morning stiffness, muscle tenderness and myalgias. Negative for joint swelling.  Skin: Negative for color change, rash and  redness.  Allergic/Immunologic: Negative for susceptible to infections.  Neurological: Negative for dizziness, numbness, headaches, memory loss and weakness.  Hematological: Negative for bruising/bleeding tendency.  Psychiatric/Behavioral: Negative for confusion.    PMFS History:  Patient Active Problem List   Diagnosis Date Noted  . DDD (degenerative disc disease), lumbar 02/08/2017  . DDD (degenerative disc disease), cervical 02/08/2017  . Primary osteoarthritis of both hips 02/08/2017  . Bilateral primary osteoarthritis of knee 02/08/2017  . History of renal insufficiency 02/08/2017  . Plantar fasciitis 02/08/2017  . History of bilateral total hip arthroplasty 02/08/2017  . History of total knee replacement 02/08/2017  . History of depression 02/08/2017  . Trapezius muscle spasm 07/18/2016  . Bilateral sacroiliitis (HCC) 07/18/2016  . Rheumatoid arthritis involving multiple sites with positive rheumatoid factor (HCC) 07/17/2016  . High risk medications (not anticoagulants) long-term use 07/17/2016  . Fibromyalgia 07/17/2016  . Fatigue 07/17/2016  . Insomnia 07/17/2016    Past Medical History:  Diagnosis Date  . CRI (chronic renal insufficiency)   . DDD (degenerative disc disease), cervical   . DDD (degenerative disc disease), lumbar   . Depression   . Fibromyalgia   . OA (osteoarthritis) of knee   . Osteoarthritis of hip   . Plantar fasciitis, bilateral   . Rheumatoid arthritis (HCC)     History reviewed. No pertinent family history. Past Surgical History:  Procedure Laterality Date  . ANKLE FRACTURE SURGERY    . APPENDECTOMY    . CESAREAN SECTION     x2  . ELBOW SURGERY    . JOINT REPLACEMENT     BIL knee   .  JOINT REPLACEMENT     BIL hip  . KNEE ARTHROPLASTY    . NECK SURGERY    . REPLACEMENT TOTAL KNEE Bilateral   . TOTAL HIP ARTHROPLASTY Bilateral    Social History   Social History Narrative  . Not on file   Immunization History  Administered  Date(s) Administered  . Influenza,inj,Quad PF,6+ Mos 07/13/2016  . Influenza-Unspecified 07/13/2016  . Pneumococcal Conjugate-13 05/29/2017     Objective: Vital Signs: BP (!) 167/84 (BP Location: Right Arm, Patient Position: Sitting, Cuff Size: Normal)   Pulse 76   Resp 15   Ht 5\' 6"  (1.676 m)   Wt 172 lb (78 kg)   BMI 27.76 kg/m    Physical Exam Vitals and nursing note reviewed.  Constitutional:      Appearance: She is well-developed and well-nourished.  HENT:     Head: Normocephalic and atraumatic.  Eyes:     Extraocular Movements: EOM normal.     Conjunctiva/sclera: Conjunctivae normal.  Cardiovascular:     Rate and Rhythm: Normal rate and regular rhythm.     Pulses: Intact distal pulses.     Heart sounds: Normal heart sounds.  Pulmonary:     Effort: Pulmonary effort is normal.     Breath sounds: Normal breath sounds.  Abdominal:     General: Bowel sounds are normal.     Palpations: Abdomen is soft.  Musculoskeletal:     Cervical back: Normal range of motion.  Lymphadenopathy:     Cervical: No cervical adenopathy.  Skin:    General: Skin is warm and dry.     Capillary Refill: Capillary refill takes less than 2 seconds.  Neurological:     Mental Status: She is alert and oriented to person, place, and time.  Psychiatric:        Mood and Affect: Mood and affect normal.        Behavior: Behavior normal.      Musculoskeletal Exam: She has some discomfort range of motion for cervical spine.  She has discomfort range of motion for lumbar spine.  She has limited range of motion of her right shoulder joint.  Left shoulder joint was in full range of motion.  Elbow joints, wrist joints, MCPs PIPs and DIPs with good range of motion.  She has bilateral CMC PIP and DIP thickening with no synovitis.  Hip joints and knee joints in good range of motion with no synovitis.  She had no tenderness over MTPs.  CDAI Exam: CDAI Score: 0.5  Patient Global: 4 mm; Provider Global: 1  mm Swollen: 0 ; Tender: 0  Joint Exam 09/29/2020   No joint exam has been documented for this visit   There is currently no information documented on the homunculus. Go to the Rheumatology activity and complete the homunculus joint exam.  Investigation: No additional findings.  Imaging: No results found.  Recent Labs: Lab Results  Component Value Date   WBC 4.4 05/05/2020   HGB 12.7 05/05/2020   PLT 249 05/05/2020   NA 141 05/05/2020   K 4.4 05/05/2020   CL 107 05/05/2020   CO2 25 05/05/2020   GLUCOSE 98 05/05/2020   BUN 14 05/05/2020   CREATININE 1.05 (H) 05/05/2020   BILITOT 0.2 05/05/2020   ALKPHOS 98 02/16/2017   AST 28 05/05/2020   ALT 24 05/05/2020   PROT 6.1 05/05/2020   ALBUMIN 3.8 02/16/2017   CALCIUM 9.2 05/05/2020   GFRAA 63 05/05/2020    Speciality Comments: PLQ Eye  Exam:: 06/04/2020 WNL @ [facility] Follow up in 1 year Osteoporosis managed by another provider.  Procedures:  No procedures performed Allergies: Patient has no known allergies.   Assessment / Plan:     Visit Diagnoses: Rheumatoid arthritis involving multiple sites with positive rheumatoid factor (HCC)-she had no synovitis on my examination.  She is tolerating hydroxychloroquine well.  We will check labs today.  Her eye examination will be due in September.  High risk medications (not anticoagulants) long-term use - Plaquenil 200 mg twice daily Monday through Friday only. PLQ Eye Exam:: 06/04/2020 - Plan: CBC with Differential/Platelet, COMPLETE METABOLIC PANEL WITH GFR  Chronic left shoulder pain-she has chronic discomfort.  She seen Dr. Cleophas Dunker in the past.  History of bilateral total hip arthroplasty-followed by Dr. Cleophas Dunker.  History of total bilateral knee replacement-chronic discomfort.  DDD (degenerative disc disease), cervical-she has limited range of motion.  DDD (degenerative disc disease), lumbar - referred to pain management.  She did not go to pain  management.  Fibromyalgia-she continues to have generalized pain and discomfort.  Other fatigue  Primary insomnia-patient states Ambien was not effective when she discontinued the medication.  She suffers from chronic insomnia.  She will discuss other treatment options with her PCP.  Age-related osteoporosis without current pathological fracture - February 23, 2017 T score -2.7, BMD 0.529 right radius with no comparison.  Patient has not been taking alendronate.  She does not recall when she took it last. - Plan: DG BONE DENSITY (DXA)  History of depression  History of renal insufficiency-her creatinine is mildly elevated.  GFR is in 60s.  Educated about COVID-19 virus infection-patient states she had COVID-19 infection in the past and had mild symptoms.  She does not want to get vaccination.  She states she is getting mixed messages from the media.  Detailed counseling guarding COVID-19 infection was provided.  Use of mask, social distancing and hand hygiene was discussed.  Benefit of getting COVID-19 vaccination was discussed.  Formation was placed in the AVS.  Orders: Orders Placed This Encounter  Procedures  . DG BONE DENSITY (DXA)  . CBC with Differential/Platelet  . COMPLETE METABOLIC PANEL WITH GFR   No orders of the defined types were placed in this encounter.   Follow-Up Instructions: Return in about 5 months (around 02/27/2021) for Rheumatoid arthritis.   Pollyann Savoy, MD  Note - This record has been created using Animal nutritionist.  Chart creation errors have been sought, but may not always  have been located. Such creation errors do not reflect on  the standard of medical care.

## 2020-09-29 ENCOUNTER — Other Ambulatory Visit: Payer: Self-pay

## 2020-09-29 ENCOUNTER — Ambulatory Visit: Payer: Medicare Other | Admitting: Rheumatology

## 2020-09-29 ENCOUNTER — Encounter: Payer: Self-pay | Admitting: Rheumatology

## 2020-09-29 VITALS — BP 167/84 | HR 76 | Resp 15 | Ht 66.0 in | Wt 172.0 lb

## 2020-09-29 DIAGNOSIS — Z87448 Personal history of other diseases of urinary system: Secondary | ICD-10-CM

## 2020-09-29 DIAGNOSIS — M0579 Rheumatoid arthritis with rheumatoid factor of multiple sites without organ or systems involvement: Secondary | ICD-10-CM

## 2020-09-29 DIAGNOSIS — M25512 Pain in left shoulder: Secondary | ICD-10-CM | POA: Diagnosis not present

## 2020-09-29 DIAGNOSIS — Z96643 Presence of artificial hip joint, bilateral: Secondary | ICD-10-CM | POA: Diagnosis not present

## 2020-09-29 DIAGNOSIS — Z7189 Other specified counseling: Secondary | ICD-10-CM

## 2020-09-29 DIAGNOSIS — R5383 Other fatigue: Secondary | ICD-10-CM

## 2020-09-29 DIAGNOSIS — M5030 Other cervical disc degeneration, unspecified cervical region: Secondary | ICD-10-CM

## 2020-09-29 DIAGNOSIS — Z79899 Other long term (current) drug therapy: Secondary | ICD-10-CM | POA: Diagnosis not present

## 2020-09-29 DIAGNOSIS — Z96653 Presence of artificial knee joint, bilateral: Secondary | ICD-10-CM

## 2020-09-29 DIAGNOSIS — Z8659 Personal history of other mental and behavioral disorders: Secondary | ICD-10-CM

## 2020-09-29 DIAGNOSIS — G8929 Other chronic pain: Secondary | ICD-10-CM

## 2020-09-29 DIAGNOSIS — M5136 Other intervertebral disc degeneration, lumbar region: Secondary | ICD-10-CM

## 2020-09-29 DIAGNOSIS — M797 Fibromyalgia: Secondary | ICD-10-CM

## 2020-09-29 DIAGNOSIS — F5101 Primary insomnia: Secondary | ICD-10-CM

## 2020-09-29 DIAGNOSIS — M810 Age-related osteoporosis without current pathological fracture: Secondary | ICD-10-CM

## 2020-09-29 NOTE — Patient Instructions (Signed)
  Please a schedule an appointment with your PCP to discuss elevated blood pressure and insomnia.  COVID-19 vaccine recommendations:   COVID-19 vaccine is recommended for everyone (unless you are allergic to a vaccine component), even if you are on a medication that suppresses your immune system.    Do not take Tylenol or any anti-inflammatory medications (NSAIDs) 24 hours prior to the COVID-19 vaccination.   There is no direct evidence about the efficacy of the COVID-19 vaccine in individuals who are on medications that suppress the immune system.   Even if you are fully vaccinated, and you are on any medications that suppress your immune system, please continue to wear a mask, maintain at least six feet social distance and practice hand hygiene.   If you develop a COVID-19 infection, please contact your PCP or our office to determine if you need monoclonal antibody infusion.  The booster vaccine is now available for immunocompromised patients.   Please see the following web sites for updated information.   [email or web address]

## 2020-09-30 LAB — CBC WITH DIFFERENTIAL/PLATELET
Absolute Monocytes: 390 cells/uL (ref 200–950)
Basophils Absolute: 41 cells/uL (ref 0–200)
Basophils Relative: 1 %
Eosinophils Absolute: 90 cells/uL (ref 15–500)
Eosinophils Relative: 2.2 %
HCT: 40.9 % (ref 35.0–45.0)
Hemoglobin: 13.5 g/dL (ref 11.7–15.5)
Lymphs Abs: 1160 cells/uL (ref 850–3900)
MCH: 31.3 pg (ref 27.0–33.0)
MCHC: 33 g/dL (ref 32.0–36.0)
MCV: 94.9 fL (ref 80.0–100.0)
MPV: 9.3 fL (ref 7.5–12.5)
Monocytes Relative: 9.5 %
Neutro Abs: 2419 cells/uL (ref 1500–7800)
Neutrophils Relative %: 59 %
Platelets: 286 10*3/uL (ref 140–400)
RBC: 4.31 10*6/uL (ref 3.80–5.10)
RDW: 13 % (ref 11.0–15.0)
Total Lymphocyte: 28.3 %
WBC: 4.1 10*3/uL (ref 3.8–10.8)

## 2020-09-30 LAB — COMPLETE METABOLIC PANEL WITH GFR
AG Ratio: 1.8 (calc) (ref 1.0–2.5)
ALT: 19 U/L (ref 6–29)
AST: 19 U/L (ref 10–35)
Albumin: 4.2 g/dL (ref 3.6–5.1)
Alkaline phosphatase (APISO): 62 U/L (ref 37–153)
BUN/Creatinine Ratio: 15 (calc) (ref 6–22)
BUN: 16 mg/dL (ref 7–25)
CO2: 28 mmol/L (ref 20–32)
Calcium: 9.7 mg/dL (ref 8.6–10.4)
Chloride: 107 mmol/L (ref 98–110)
Creat: 1.04 mg/dL — ABNORMAL HIGH (ref 0.50–0.99)
GFR, Est African American: 64 mL/min/{1.73_m2} (ref >=60)
GFR, Est Non African American: 55 mL/min/{1.73_m2} — ABNORMAL LOW (ref >=60)
Globulin: 2.3 g/dL (calc) (ref 1.9–3.7)
Glucose, Bld: 88 mg/dL (ref 65–99)
Potassium: 4.7 mmol/L (ref 3.5–5.3)
Sodium: 142 mmol/L (ref 135–146)
Total Bilirubin: 0.3 mg/dL (ref 0.2–1.2)
Total Protein: 6.5 g/dL (ref 6.1–8.1)

## 2020-10-20 ENCOUNTER — Other Ambulatory Visit: Payer: Self-pay | Admitting: Physician Assistant

## 2020-10-21 NOTE — Telephone Encounter (Signed)
Last Visit:09/29/2020 Next Visit: 03/02/2021 Labs: 09/29/2020, CBC WNL. Creatinine is borderline elevated-1.04 and GFR is low 55 but stable. Please advise the patient to avoid taking NSAIDs. We will continue to monitor lab work closely Eye exam: 9/10-/2021 WNL  Current Dose per office note 09/29/2020,  Plaquenil 200 mg twice daily Monday through Friday only.  DX: Rheumatoid arthritis involving multiple sites with positive rheumatoid factor   Okay to refill Plaquenil?

## 2020-12-18 ENCOUNTER — Other Ambulatory Visit: Payer: Self-pay | Admitting: Physician Assistant

## 2020-12-19 NOTE — Telephone Encounter (Signed)
Last Visit: 09/29/2020 Next Visit: 03/02/2021 Labs: 09/29/2020, CBC WNL. Creatinine is borderline elevated-1.04 and GFR is low 55 but stable. Please advise the patient to avoid taking NSAIDs. We will continue to monitor lab work closely Eye exam: 06/04/2020  Current Dose per office note 09/29/2020, Plaquenil 200 mg twice daily Monday through Friday only [id number] arthritis involving multiple sites with positive rheumatoid factor   Last Fill: 1/272022  Okay to refill Plaquenil?

## 2021-02-17 NOTE — Progress Notes (Signed)
Office Visit Note  Patient: Tammie [name]             Date of Birth: 06/03/1951           MRN: [id number]             PCP: Amelia Jo, FNP Referring: Amelia Jo, FNP Visit Date: 03/02/2021 Occupation: @GUAROCC @  Subjective:  Chronic lower back pain   History of Present Illness: Tammie [name] is a 70 y.o. female with history of seropositive rheumatoid arthritis, DDD, and fibromyalgia. She is taking plaquenil 200 mg 1 tablet by mouth twice daily Monday through Friday.  She continues to tolerate PLQ without any side effects.  She has not missed any doses of PLQ recently.  She states she continues to have stiffness in both hands and both wrist joints.  She denies any joint swelling at this time.  She has ongoing lower back pain and stiffness.  She is scheduled for an upcoming spinal nerve ablation next week.  She takes tylenol or oxycodone for pain relief.  She continues to have nocturnal pain.  She takes Saturday as needed for insomnia.      Activities of Daily Living:  Patient reports morning stiffness for 2-3 hours.   Patient Reports nocturnal pain.  Difficulty dressing/grooming: Reports Difficulty climbing stairs: Reports Difficulty getting out of chair: Reports Difficulty using hands for taps, buttons, cutlery, and/or writing: Reports  Review of Systems  Constitutional: Negative for fatigue.  HENT: Negative for mouth sores, mouth dryness and nose dryness.   Eyes: Negative for pain, itching and dryness.  Respiratory: Negative for shortness of breath and difficulty breathing.   Cardiovascular: Negative for chest pain and palpitations.  Gastrointestinal: Positive for diarrhea. Negative for blood in stool and constipation.  Endocrine: Negative for increased urination.  Genitourinary: Negative for difficulty urinating.  Musculoskeletal: Positive for arthralgias, joint pain, joint swelling, myalgias, morning stiffness, muscle tenderness and myalgias.   Skin: Negative for color change, rash and redness.  Allergic/Immunologic: Negative for susceptible to infections.  Neurological: Negative for dizziness, numbness, headaches, memory loss and weakness.  Hematological: Negative for bruising/bleeding tendency.  Psychiatric/Behavioral: Negative for confusion.    PMFS History:  Patient Active Problem List   Diagnosis Date Noted  . DDD (degenerative disc disease), lumbar 02/08/2017  . DDD (degenerative disc disease), cervical 02/08/2017  . Primary osteoarthritis of both hips 02/08/2017  . Bilateral primary osteoarthritis of knee 02/08/2017  . History of renal insufficiency 02/08/2017  . Plantar fasciitis 02/08/2017  . History of bilateral total hip arthroplasty 02/08/2017  . History of total knee replacement 02/08/2017  . History of depression 02/08/2017  . Trapezius muscle spasm 07/18/2016  . Bilateral sacroiliitis (HCC) 07/18/2016  . Rheumatoid arthritis involving multiple sites with positive rheumatoid factor (HCC) 07/17/2016  . High risk medications (not anticoagulants) long-term use 07/17/2016  . Fibromyalgia 07/17/2016  . Fatigue 07/17/2016  . Insomnia 07/17/2016    Past Medical History:  Diagnosis Date  . CRI (chronic renal insufficiency)   . DDD (degenerative disc disease), cervical   . DDD (degenerative disc disease), lumbar   . Depression   . Fibromyalgia   . OA (osteoarthritis) of knee   . Osteoarthritis of hip   . Plantar fasciitis, bilateral   . Rheumatoid arthritis (HCC)     History reviewed. No pertinent family history. Past Surgical History:  Procedure Laterality Date  . ANKLE FRACTURE SURGERY    . APPENDECTOMY    . CESAREAN SECTION  x2  . CHOLECYSTECTOMY    . ELBOW SURGERY    . JOINT REPLACEMENT     BIL knee   . JOINT REPLACEMENT     BIL hip  . KNEE ARTHROPLASTY    . NECK SURGERY    . REPLACEMENT TOTAL KNEE Bilateral   . TOTAL HIP ARTHROPLASTY Bilateral    Social History   Social History  Narrative  . Not on file   Immunization History  Administered Date(s) Administered  . Influenza,inj,Quad PF,6+ Mos 07/13/2016  . Influenza-Unspecified 07/13/2016  . Pneumococcal Conjugate-13 05/29/2017     Objective: Vital Signs: BP (!) 151/81 (BP Location: Right Arm, Patient Position: Sitting, Cuff Size: Normal)   Pulse 90   Ht 5\' 6"  (1.676 m)   Wt 174 lb (78.9 kg)   BMI 28.08 kg/m    Physical Exam Vitals and nursing note reviewed.  Constitutional:      Appearance: She is well-developed.  HENT:     Head: Normocephalic and atraumatic.  Eyes:     Conjunctiva/sclera: Conjunctivae normal.  Pulmonary:     Effort: Pulmonary effort is normal.  Abdominal:     Palpations: Abdomen is soft.  Musculoskeletal:     Cervical back: Normal range of motion.  Skin:    General: Skin is warm and dry.     Capillary Refill: Capillary refill takes less than 2 seconds.  Neurological:     Mental Status: She is alert and oriented to person, place, and time.  Psychiatric:        Behavior: Behavior normal.      Musculoskeletal Exam: Generalized hyperalgesia and positive tender points. C-spine limited ROM with lateral rotation.  Painful ROM of lumbar spine.  Shoulder joints, elbow joints, wrist joints, MCPs, PIPs, and DIPs good ROM with no synovitis.  Tenderness over both wrist joints.  Tenderness over the left 5th MCP joint.  Complete fist formation bilaterally.  Bilateral hip replacements have limited ROM.  Bilateral knee replacement have good ROM with no discomfort. Ankle joints have good ROM with no tenderness or joint swelling.   CDAI Exam: CDAI Score: 4.2  Patient Global: 6 mm; Provider Global: 6 mm Swollen: 0 ; Tender: 3  Joint Exam 03/02/2021      Right  Left  Wrist   Tender   Tender  MCP 5      Tender     Investigation: No additional findings.  Imaging: No results found.  Recent Labs: Lab Results  Component Value Date   WBC 4.1 09/29/2020   HGB 13.5 09/29/2020   PLT 286  09/29/2020   NA 142 09/29/2020   K 4.7 09/29/2020   CL 107 09/29/2020   CO2 28 09/29/2020   GLUCOSE 88 09/29/2020   BUN 16 09/29/2020   CREATININE 1.04 (H) 09/29/2020   BILITOT 0.3 09/29/2020   ALKPHOS 98 02/16/2017   AST 19 09/29/2020   ALT 19 09/29/2020   PROT 6.5 09/29/2020   ALBUMIN 3.8 02/16/2017   CALCIUM 9.7 09/29/2020   GFRAA 64 09/29/2020    Speciality Comments: PLQ Eye Exam:: 06/04/2020 WNL @ [facility] Follow up in 1 year Osteoporosis managed by another provider.  Procedures:  No procedures performed Allergies: Patient has no known allergies.   Assessment / Plan:     Visit Diagnoses: Rheumatoid arthritis involving multiple sites with positive rheumatoid factor (HCC): She has no synovitis on examination today.  She has tenderness to palpation over both wrist joints but no synovitis or tenosynovitis.  She continues to experience stiffness in both hands and both wrist joints lasting 2 to 3 hours on a daily basis.  Overall she is clinically doing well taking plaquenil 200 mg 1 tablet by mouth twice daily Monday through Friday.  She continues to tolerate PLQ without any side effects and has not missed any doses recently.  Most of her discomfort seems to be due to underlying osteoarthritis and fibromyalgia.  She has generalized allergies and positive tender points on examination today.  She declined a referral to pain management as well as water therapy.  She was encouraged to continue to take Plaquenil as prescribed.  She was advised to notify us if she develops any eye inflammation.  She will follow-up in the office in 5 months.   High risk medications (not anticoagulants) long-term use - Plaquenil 200 mg 1 tablet by mouth twice daily Monday through Friday only. CBC and CMP updated on 02/03/21. PLQ Eye Exam: 06/04/2020 WNL @ [facility] Follow up in 1 year.   Chronic left shoulder pain: She has good range of motion of the left shoulder joint with no discomfort on exam  today.  History of bilateral total hip arthroplasty: She has limited ROM of both hip replacements.    History of total bilateral knee replacement: Doing well.  She has good ROM with no discomfort at this time.   DDD (degenerative disc disease), cervical: She has trapezius muscle tension and tenderness bilaterally.  Discussed trying massage therapy.   DDD (degenerative disc disease), lumbar: She continues to have chronic lower back pain.  She is scheduled for an ablation next week.  She declined a referral to pain management.  She declined a referral to water therapy.  Fibromyalgia: She has generalized hyperalgesia and positive tender points.  She continues to experience generalized myalgias and muscle tenderness on a daily basis due to underlying fibromyalgia.  She has ongoing nocturnal pain which worsens her insomnia.  Prior to the patient she has been taking Tylenol as needed for pain relief and occasionally takes leftover oxycodone that she has for severe pain.  She declined a referral to pain management at this time.  She declined a referral to water therapy.  Other fatigue: Secondary to insomnia. Discussed the importance of regular exercise.   Primary insomnia: Her nocturnal pain is contributing to her difficulty falling asleep and staying asleep. She has been taking ambien sparingly for insomnia.    Age-related osteoporosis without current pathological fracture - February 23, 2017 T score -2.7, BMD 0.529 right radius with no comparison.  Patient has not been taking alendronate. Future order for DEXA remains in place.  She is not taking a calcium or vitamin D supplement as recommended.  She has not had any recent falls or fractures.   Other medical conditions are listed as follows:   History of depression  History of renal insufficiency  Orders: No orders of the defined types were placed in this encounter.  No orders of the defined types were placed in this encounter.   Follow-Up  Instructions: Return in about 5 months (around 08/02/2021) for Rheumatoid arthritis, Fibromyalgia, DDD.   Gearldine Bienenstock, PA-C  Note - This record has been created using Dragon software.  Chart creation errors have been sought, but may not always  have been located. Such creation errors do not reflect on  the standard of medical care.

## 2021-03-02 ENCOUNTER — Encounter: Payer: Self-pay | Admitting: Physician Assistant

## 2021-03-02 ENCOUNTER — Ambulatory Visit: Payer: Medicare Other | Admitting: Physician Assistant

## 2021-03-02 ENCOUNTER — Other Ambulatory Visit: Payer: Self-pay

## 2021-03-02 VITALS — BP 151/81 | HR 90 | Ht 66.0 in | Wt 174.0 lb

## 2021-03-02 DIAGNOSIS — M0579 Rheumatoid arthritis with rheumatoid factor of multiple sites without organ or systems involvement: Secondary | ICD-10-CM

## 2021-03-02 DIAGNOSIS — M5136 Other intervertebral disc degeneration, lumbar region: Secondary | ICD-10-CM

## 2021-03-02 DIAGNOSIS — M25512 Pain in left shoulder: Secondary | ICD-10-CM

## 2021-03-02 DIAGNOSIS — Z96653 Presence of artificial knee joint, bilateral: Secondary | ICD-10-CM

## 2021-03-02 DIAGNOSIS — F5101 Primary insomnia: Secondary | ICD-10-CM

## 2021-03-02 DIAGNOSIS — M810 Age-related osteoporosis without current pathological fracture: Secondary | ICD-10-CM

## 2021-03-02 DIAGNOSIS — Z87448 Personal history of other diseases of urinary system: Secondary | ICD-10-CM

## 2021-03-02 DIAGNOSIS — M797 Fibromyalgia: Secondary | ICD-10-CM

## 2021-03-02 DIAGNOSIS — G8929 Other chronic pain: Secondary | ICD-10-CM

## 2021-03-02 DIAGNOSIS — M5030 Other cervical disc degeneration, unspecified cervical region: Secondary | ICD-10-CM

## 2021-03-02 DIAGNOSIS — Z79899 Other long term (current) drug therapy: Secondary | ICD-10-CM

## 2021-03-02 DIAGNOSIS — Z96643 Presence of artificial hip joint, bilateral: Secondary | ICD-10-CM | POA: Diagnosis not present

## 2021-03-02 DIAGNOSIS — R5383 Other fatigue: Secondary | ICD-10-CM

## 2021-03-02 DIAGNOSIS — Z8659 Personal history of other mental and behavioral disorders: Secondary | ICD-10-CM

## 2021-03-31 ENCOUNTER — Other Ambulatory Visit: Payer: Self-pay | Admitting: Rheumatology

## 2021-04-01 NOTE — Telephone Encounter (Signed)
Last Visit: 03/02/2021  Next Visit: 08/03/2021  Labs: 02/03/2021 WBC 3.4, RBC 4.07  Eye exam: 06/04/2020 WNL   Current Dose per office note 03/02/2021: plaquenil 200 mg 1 tablet by mouth twice daily Monday through Friday [id number] arthritis involving multiple sites with positive rheumatoid factor  Last Fill: 12/20/2020  Okay to refill Plaquenil?

## 2021-05-29 ENCOUNTER — Other Ambulatory Visit: Payer: Self-pay | Admitting: Physician Assistant

## 2021-07-20 NOTE — Progress Notes (Signed)
Office Visit Note  Patient: Tammie [name]             Date of Birth: 05/22/1951           MRN: [id number]             PCP: Amelia Jo, FNP Referring: Amelia Jo, FNP Visit Date: 08/03/2021 Occupation: @GUAROCC @  Subjective:  Generalized pain.   History of Present Illness: Tammie [name] is a 70 y.o. female with a history of rheumatoid arthritis, osteoarthritis and fibromyalgia syndrome.  She states 2 weeks ago she tripped over her dog and fractured her right wrist.  She had surgery for it.  She is in a cast and a arm sling.  Due to wearing the sling she has been having increased pain and discomfort in her right shoulder and neck.  She is taking hydroxychloroquine on a regular basis.  She does not experience any joint swelling.  Her bilateral hip joints and knee joints are doing well.  She states she continues to have neck and lower back discomfort.  She has been experiencing increased lower back pain recently.  She continues to have generalized pain and discomfort from fibromyalgia.  Activities of Daily Living:  Patient reports morning stiffness for 10 minutes.   Patient Reports nocturnal pain.  Difficulty dressing/grooming: Reports Difficulty climbing stairs: Reports Difficulty getting out of chair: Reports Difficulty using hands for taps, buttons, cutlery, and/or writing: Reports  Review of Systems  Constitutional:  Positive for fatigue. Negative for night sweats, weight gain and weight loss.  HENT:  Negative for mouth sores, trouble swallowing, trouble swallowing, mouth dryness and nose dryness.   Eyes:  Negative for pain, redness, visual disturbance and dryness.  Respiratory:  Negative for cough, shortness of breath and difficulty breathing.   Cardiovascular:  Negative for chest pain, palpitations, hypertension, irregular heartbeat and swelling in legs/feet.  Gastrointestinal:  Positive for abdominal pain, constipation and diarrhea. Negative for blood  in stool.  Endocrine: Negative for increased urination.  Genitourinary:  Negative for vaginal dryness.  Musculoskeletal:  Positive for joint pain, joint pain, myalgias, morning stiffness and myalgias. Negative for joint swelling, muscle weakness and muscle tenderness.  Skin:  Negative for color change, rash, hair loss, skin tightness, ulcers and sensitivity to sunlight.  Allergic/Immunologic: Negative for susceptible to infections.  Neurological:  Negative for dizziness, memory loss, night sweats and weakness.  Hematological:  Negative for swollen glands.  Psychiatric/Behavioral:  Positive for depressed mood. Negative for sleep disturbance. The patient is not nervous/anxious.    PMFS History:  Patient Active Problem List   Diagnosis Date Noted   DDD (degenerative disc disease), lumbar 02/08/2017   DDD (degenerative disc disease), cervical 02/08/2017   Primary osteoarthritis of both hips 02/08/2017   Bilateral primary osteoarthritis of knee 02/08/2017   History of renal insufficiency 02/08/2017   Plantar fasciitis 02/08/2017   History of bilateral total hip arthroplasty 02/08/2017   History of total knee replacement 02/08/2017   History of depression 02/08/2017   Trapezius muscle spasm 07/18/2016   Bilateral sacroiliitis (HCC) 07/18/2016   Rheumatoid arthritis involving multiple sites with positive rheumatoid factor (HCC) 07/17/2016   High risk medications (not anticoagulants) long-term use 07/17/2016   Fibromyalgia 07/17/2016   Fatigue 07/17/2016   Insomnia 07/17/2016    Past Medical History:  Diagnosis Date   CRI (chronic renal insufficiency)    DDD (degenerative disc disease), cervical    DDD (degenerative disc disease), lumbar    Depression  Fibromyalgia    OA (osteoarthritis) of knee    Osteoarthritis of hip    Plantar fasciitis, bilateral    Rheumatoid arthritis (HCC)     History reviewed. No pertinent family history. Past Surgical History:  Procedure Laterality  Date   ANKLE FRACTURE SURGERY     APPENDECTOMY     CESAREAN SECTION     x2   CHOLECYSTECTOMY     ELBOW SURGERY     JOINT REPLACEMENT     BIL knee    JOINT REPLACEMENT     BIL hip   KNEE ARTHROPLASTY     NECK SURGERY     REPLACEMENT TOTAL KNEE Bilateral    TOTAL HIP ARTHROPLASTY Bilateral    WRIST SURGERY Right    Social History   Social History Narrative   Not on file   Immunization History  Administered Date(s) Administered   Influenza,inj,Quad PF,6+ Mos 07/13/2016   Influenza-Unspecified 07/13/2016   Pneumococcal Conjugate-13 05/29/2017     Objective: Vital Signs: BP (!) 161/90 (BP Location: Left Arm, Patient Position: Sitting, Cuff Size: Small)   Pulse 67   Resp 12   Ht 5\' 6"  (1.676 m)   Wt 175 lb 3.2 oz (79.5 kg)   BMI 28.28 kg/m    Physical Exam Vitals and nursing note reviewed.  Constitutional:      Appearance: She is well-developed.  HENT:     Head: Normocephalic and atraumatic.  Eyes:     Conjunctiva/sclera: Conjunctivae normal.  Cardiovascular:     Rate and Rhythm: Normal rate and regular rhythm.     Heart sounds: Normal heart sounds.  Pulmonary:     Effort: Pulmonary effort is normal.     Breath sounds: Normal breath sounds.  Abdominal:     General: Bowel sounds are normal.     Palpations: Abdomen is soft.  Musculoskeletal:     Cervical back: Normal range of motion.  Lymphadenopathy:     Cervical: No cervical adenopathy.  Skin:    General: Skin is warm and dry.     Capillary Refill: Capillary refill takes less than 2 seconds.  Neurological:     Mental Status: She is alert and oriented to person, place, and time.  Psychiatric:        Behavior: Behavior normal.     Musculoskeletal Exam: She had good range of motion of her cervical spine with trapezius spasm.  She has some thoracic kyphosis.  She had tenderness on palpation of her lumbar spine without any point tenderness.  Right arm was in a sling.  Left shoulder joint was in good range of  motion.  There was no tenderness over left elbow, left wrist joint, MCPs PIPs and DIPs.  She had bilateral PIP and DIP thickening with no synovitis.  Hip joints are replaced and were in good range of motion.  Knee joints were replaced and were in good range of motion.  She had no tenderness over ankles and MTPs.  CDAI Exam: CDAI Score: 1.6  Patient Global: 3 mm; Provider Global: 3 mm Swollen: 0 ; Tender: 3  Joint Exam 08/03/2021      Right  Left  Glenohumeral   Tender     Cervical Spine   Tender     Lumbar Spine   Tender        Investigation: No additional findings.  Imaging: No results found.  Recent Labs: Lab Results  Component Value Date   WBC 4.1 09/29/2020   HGB 13.5 09/29/2020  PLT 286 09/29/2020   NA 142 09/29/2020   K 4.7 09/29/2020   CL 107 09/29/2020   CO2 28 09/29/2020   GLUCOSE 88 09/29/2020   BUN 16 09/29/2020   CREATININE 1.04 (H) 09/29/2020   BILITOT 0.3 09/29/2020   ALKPHOS 98 02/16/2017   AST 19 09/29/2020   ALT 19 09/29/2020   PROT 6.5 09/29/2020   ALBUMIN 3.8 02/16/2017   CALCIUM 9.7 09/29/2020   GFRAA 64 09/29/2020    Speciality Comments: PLQ Eye Exam:: 06/04/2020 WNL @ [facility] Follow up in 1 year Osteoporosis managed by another provider.  Procedures:  No procedures performed Allergies: Patient has no known allergies.   Assessment / Plan:     Visit Diagnoses: Rheumatoid arthritis involving multiple sites with positive rheumatoid factor (HCC)-she had no synovitis on my examination.  She has been tolerating hydroxychloroquine without any side effects.  High risk medications (not anticoagulants) long-term use - Plaquenil 200 mg 1 tablet by mouth twice daily Monday through Friday only. PLQ Eye Exam:: 06/04/2020  -patient states that she had her eye examination about 4 months ago.  We will request records.  Plan: CBC with Differential/Platelet, COMPLETE METABOLIC PANEL WITH GFR today and then every 5 months.  Updated information about  immunization was placed in the AVS.  Elevated creatinine-we will continue to monitor labs.  Closed fracture of right wrist joint sequela -she tripped over her dog and fractured her right wrist joint 2 weeks ago.  She had surgery.  She is in a cast and she has been using a arm sling.  Chronic left shoulder pain-improved for patient.  History of bilateral total hip arthroplasty-she had good range of motion without discomfort.  History of total bilateral knee replacement-she had no discomfort range of motion.  DDD (degenerative disc disease), cervical-chronic pain  DDD (degenerative disc disease), lumbar-she continues to have lower back pain.  Core strengthening exercises were emphasized.  Fibromyalgia-she continues to have generalized pain and discomfort.  It is difficult for her to exercise currently with arm sling.  Regular exercise was emphasized.  Primary insomnia-good sleep hygiene was discussed.  Other fatigue-related to insomnia and fibromyalgia.  Age-related osteoporosis without current pathological fracture - February 23, 2017 T score -2.7, BMD 0.529 right radius with no comparison. Patient has not been taking alendronate.  An order was placed for repeat DEXA scan at the last visit.  She is been advised to contact [facility] to schedule DEXA.  Use of calcium rich diet, vitamin D and exercise was emphasized.  History of renal insufficiency-her creatinine has been mildly elevated.  History of depression  Orders: Orders Placed This Encounter  Procedures   CBC with Differential/Platelet   COMPLETE METABOLIC PANEL WITH GFR    No orders of the defined types were placed in this encounter.    Follow-Up Instructions: Return in about 3 months (around 11/03/2021) for Rheumatoid arthritis, Osteoarthritis, Osteoporosis.   Pollyann Savoy, MD  Note - This record has been created using Animal nutritionist.  Chart creation errors have been sought, but may not always  have been located. Such  creation errors do not reflect on  the standard of medical care.

## 2021-08-03 ENCOUNTER — Other Ambulatory Visit: Payer: Self-pay

## 2021-08-03 ENCOUNTER — Encounter: Payer: Self-pay | Admitting: Rheumatology

## 2021-08-03 ENCOUNTER — Ambulatory Visit: Payer: Medicare Other | Admitting: Rheumatology

## 2021-08-03 VITALS — BP 161/90 | HR 67 | Resp 12 | Ht 66.0 in | Wt 175.2 lb

## 2021-08-03 DIAGNOSIS — M5030 Other cervical disc degeneration, unspecified cervical region: Secondary | ICD-10-CM

## 2021-08-03 DIAGNOSIS — Z96653 Presence of artificial knee joint, bilateral: Secondary | ICD-10-CM

## 2021-08-03 DIAGNOSIS — Z96643 Presence of artificial hip joint, bilateral: Secondary | ICD-10-CM

## 2021-08-03 DIAGNOSIS — Z8659 Personal history of other mental and behavioral disorders: Secondary | ICD-10-CM

## 2021-08-03 DIAGNOSIS — Z87448 Personal history of other diseases of urinary system: Secondary | ICD-10-CM

## 2021-08-03 DIAGNOSIS — M797 Fibromyalgia: Secondary | ICD-10-CM

## 2021-08-03 DIAGNOSIS — M5136 Other intervertebral disc degeneration, lumbar region: Secondary | ICD-10-CM

## 2021-08-03 DIAGNOSIS — M0579 Rheumatoid arthritis with rheumatoid factor of multiple sites without organ or systems involvement: Secondary | ICD-10-CM

## 2021-08-03 DIAGNOSIS — Z79899 Other long term (current) drug therapy: Secondary | ICD-10-CM | POA: Diagnosis not present

## 2021-08-03 DIAGNOSIS — F5101 Primary insomnia: Secondary | ICD-10-CM

## 2021-08-03 DIAGNOSIS — M810 Age-related osteoporosis without current pathological fracture: Secondary | ICD-10-CM

## 2021-08-03 DIAGNOSIS — S62101S Fracture of unspecified carpal bone, right wrist, sequela: Secondary | ICD-10-CM | POA: Diagnosis not present

## 2021-08-03 DIAGNOSIS — R7989 Other specified abnormal findings of blood chemistry: Secondary | ICD-10-CM | POA: Diagnosis not present

## 2021-08-03 DIAGNOSIS — R5383 Other fatigue: Secondary | ICD-10-CM

## 2021-08-03 NOTE — Patient Instructions (Addendum)
Please call [facility] to schedule the bone density scan  [phone]  Vaccines You are taking a medication(s) that can suppress your immune system.  The following immunizations are recommended: Flu annually Covid-19  Td/Tdap (tetanus, diphtheria, pertussis) every 10 years Pneumonia (Prevnar 15 then Pneumovax 23 at least 1 year apart.  Alternatively, can take Prevnar 20 without needing additional dose) Shingrix: 2 doses from 4 weeks to 6 months apart  Please check with your PCP to make sure you are up to date.

## 2021-08-04 ENCOUNTER — Telehealth: Payer: Self-pay

## 2021-08-04 ENCOUNTER — Other Ambulatory Visit: Payer: Self-pay | Admitting: *Deleted

## 2021-08-04 LAB — CBC WITH DIFFERENTIAL/PLATELET
Absolute Monocytes: 413 cells/uL (ref 200–950)
Basophils Absolute: 39 cells/uL (ref 0–200)
Basophils Relative: 1 %
Eosinophils Absolute: 82 cells/uL (ref 15–500)
Eosinophils Relative: 2.1 %
HCT: 38.6 % (ref 35.0–45.0)
Hemoglobin: 12.8 g/dL (ref 11.7–15.5)
Lymphs Abs: 1018 cells/uL (ref 850–3900)
MCH: 32.1 pg (ref 27.0–33.0)
MCHC: 33.2 g/dL (ref 32.0–36.0)
MCV: 96.7 fL (ref 80.0–100.0)
MPV: 9.3 fL (ref 7.5–12.5)
Monocytes Relative: 10.6 %
Neutro Abs: 2348 cells/uL (ref 1500–7800)
Neutrophils Relative %: 60.2 %
Platelets: 283 10*3/uL (ref 140–400)
RBC: 3.99 10*6/uL (ref 3.80–5.10)
RDW: 12 % (ref 11.0–15.0)
Total Lymphocyte: 26.1 %
WBC: 3.9 10*3/uL (ref 3.8–10.8)

## 2021-08-04 LAB — COMPLETE METABOLIC PANEL WITH GFR
AG Ratio: 1.7 (calc) (ref 1.0–2.5)
ALT: 23 U/L (ref 6–29)
AST: 23 U/L (ref 10–35)
Albumin: 4 g/dL (ref 3.6–5.1)
Alkaline phosphatase (APISO): 100 U/L (ref 37–153)
BUN: 14 mg/dL (ref 7–25)
CO2: 27 mmol/L (ref 20–32)
Calcium: 9.4 mg/dL (ref 8.6–10.4)
Chloride: 105 mmol/L (ref 98–110)
Creat: 0.84 mg/dL (ref 0.50–1.05)
Globulin: 2.3 g/dL (calc) (ref 1.9–3.7)
Glucose, Bld: 98 mg/dL (ref 65–99)
Potassium: 4.6 mmol/L (ref 3.5–5.3)
Sodium: 140 mmol/L (ref 135–146)
Total Bilirubin: 0.3 mg/dL (ref 0.2–1.2)
Total Protein: 6.3 g/dL (ref 6.1–8.1)
eGFR: 75 mL/min/{1.73_m2} (ref >=60)

## 2021-08-04 MED ORDER — HYDROXYCHLOROQUINE SULFATE 200 MG PO TABS: ORAL_TABLET | ORAL | 0 refills | Status: DC

## 2021-08-04 NOTE — Telephone Encounter (Signed)
Patient's husband called stating they tried to refill Tammie [name]'s Hydroxychloroquine medication with [facility] and was told it was denied by Dr. Corliss Skains.

## 2021-08-04 NOTE — Progress Notes (Signed)
CBC and CMP are normal.

## 2021-08-04 NOTE — Telephone Encounter (Signed)
Next Visit: 11/02/2021  Last Visit: 08/03/2021  Labs: 08/03/2021, CBC and CMP are normal.  Eye exam: 06/04/2020, patient stated she had appt 03/2021, patient will have eye exam faxed  Current Dose per office note 08/03/2021:  Plaquenil 200 mg 1 tablet by mouth twice daily Monday through Friday only  DX: Rheumatoid arthritis involving multiple sites with positive rheumatoid factor  Last Fill: 04/01/2021  Okay to refill Plaquenil?

## 2021-09-23 ENCOUNTER — Other Ambulatory Visit: Payer: Self-pay | Admitting: Physician Assistant

## 2021-10-20 NOTE — Progress Notes (Signed)
Office Visit Note  Patient: Tammie [name]             Date of Birth: 06/06/1951           MRN: [id number]             PCP: Henderson Baltimore, FNP Referring: Henderson Baltimore, FNP Visit Date: 11/02/2021 Occupation: @GUAROCC @  Subjective:  Right shoulder joint pain   History of Present Illness: Tammie [name] is a 71 y.o. female with history of rheumatoid arthritis, osteoarthritis, fibromyalgia, and osteoporosis.  Patient is currently taking Plaquenil 200 mg 1 tablet by mouth twice daily Monday through Friday.  She is tolerating Plaquenil without any side effects and has not missed any doses recently.  She presents today with increased pain in the right shoulder joint which started several months ago.  No recent injury or fall prior to the onset of symptoms.  She requested a right shoulder joint cortisone injection today.  She states that she experiences increased stiffness in both hands first thing in the morning lasting about 30 minutes daily.  She denies any joint swelling.  She has some discomfort in her toes when bearing weight but denies any inflammation.  She has had increased nocturnal pain which she attributes to fibromyalgia.  She has generalized myalgias and muscle tenderness.  She takes gabapentin 100 mg to 200 mg at bedtime and Percocet every 4 hours for pain relief.  She has been experiencing increased nocturnal pain especially in her lower back.  Activities of Daily Living:  Patient reports morning stiffness for 30 minutes.   Patient Denies nocturnal pain.  Difficulty dressing/grooming: Denies Difficulty climbing stairs: Reports Difficulty getting out of chair: Reports Difficulty using hands for taps, buttons, cutlery, and/or writing: Reports  Review of Systems  Constitutional:  Positive for fatigue.  HENT:  Positive for mouth dryness. Negative for mouth sores and nose dryness.   Eyes:  Positive for dryness. Negative for pain and visual disturbance.   Respiratory:  Negative for cough, hemoptysis, shortness of breath and difficulty breathing.   Cardiovascular:  Negative for chest pain, palpitations, hypertension and swelling in legs/feet.  Gastrointestinal:  Positive for constipation and diarrhea. Negative for blood in stool.  Endocrine: Negative for excessive thirst and increased urination.  Genitourinary:  Negative for difficulty urinating and painful urination.  Musculoskeletal:  Positive for gait problem and morning stiffness. Negative for joint pain, joint pain, joint swelling, myalgias, muscle weakness, muscle tenderness and myalgias.  Skin:  Negative for color change, pallor, rash, hair loss, nodules/bumps, skin tightness, ulcers and sensitivity to sunlight.  Allergic/Immunologic: Negative for susceptible to infections.  Neurological:  Positive for numbness. Negative for dizziness, headaches and weakness.  Hematological:  Positive for bruising/bleeding tendency. Negative for swollen glands.  Psychiatric/Behavioral:  Positive for sleep disturbance. Negative for depressed mood. The patient is not nervous/anxious.    PMFS History:  Patient Active Problem List   Diagnosis Date Noted   DDD (degenerative disc disease), lumbar 02/08/2017   DDD (degenerative disc disease), cervical 02/08/2017   Primary osteoarthritis of both hips 02/08/2017   Bilateral primary osteoarthritis of knee 02/08/2017   History of renal insufficiency 02/08/2017   Plantar fasciitis 02/08/2017   History of bilateral total hip arthroplasty 02/08/2017   History of total knee replacement 02/08/2017   History of depression 02/08/2017   Trapezius muscle spasm 07/18/2016   Bilateral sacroiliitis ([place]) 07/18/2016   Rheumatoid arthritis involving multiple sites with positive rheumatoid factor ([place]) 07/17/2016   High  risk medications (not anticoagulants) long-term use 07/17/2016   Fibromyalgia 07/17/2016   Fatigue 07/17/2016   Insomnia 07/17/2016    Past Medical  History:  Diagnosis Date   CRI (chronic renal insufficiency)    DDD (degenerative disc disease), cervical    DDD (degenerative disc disease), lumbar    Depression    Fibromyalgia    OA (osteoarthritis) of knee    Osteoarthritis of hip    Plantar fasciitis, bilateral    Rheumatoid arthritis (HCC)     History reviewed. No pertinent family history. Past Surgical History:  Procedure Laterality Date   ANKLE FRACTURE SURGERY     APPENDECTOMY     CESAREAN SECTION     x2   CHOLECYSTECTOMY     ELBOW SURGERY     JOINT REPLACEMENT     BIL knee    JOINT REPLACEMENT     BIL hip   KNEE ARTHROPLASTY     NECK SURGERY     REPLACEMENT TOTAL KNEE Bilateral    TOTAL HIP ARTHROPLASTY Bilateral    WRIST SURGERY Right    Social History   Social History Narrative   Not on file   Immunization History  Administered Date(s) Administered   Influenza,inj,Quad PF,6+ Mos 07/13/2016   Influenza-Unspecified 07/13/2016   Pneumococcal Conjugate-13 05/29/2017     Objective: Vital Signs: BP 137/79 (BP Location: Left Arm, Patient Position: Sitting, Cuff Size: Small)    Pulse 80    Resp 13    Ht 5\' 6"  (1.676 m)    Wt 173 lb 9.6 oz (78.7 kg)    BMI 28.02 kg/m    Physical Exam Vitals and nursing note reviewed.  Constitutional:      Appearance: She is well-developed.  HENT:     Head: Normocephalic and atraumatic.  Eyes:     Conjunctiva/sclera: Conjunctivae normal.  Pulmonary:     Effort: Pulmonary effort is normal.  Abdominal:     Palpations: Abdomen is soft.  Musculoskeletal:     Cervical back: Normal range of motion.  Skin:    General: Skin is warm and dry.     Capillary Refill: Capillary refill takes less than 2 seconds.  Neurological:     Mental Status: She is alert and oriented to person, place, and time.  Psychiatric:        Behavior: Behavior normal.     Musculoskeletal Exam: C-spine has limited ROM.  Painful ROM of lumbar spine.  Painful ROM of the right shoulder.  Limited  internal rotation of the right shoulder.  Left shoulder has good ROM with no discomfort.  Elbow joints, wrist joints, MCPs, PIPs, and DIPs good ROM with no synovitis.  PIP and DIP thickening consistent with ostearthritis of both hands. Bilateral hip replacements have slightly limited ROM.  Both knee replacements have good ROM.  Ankle joints have good ROM with no tenderness or joint swelling.  No tenderness over MTP joints. Bunions noted bilaterally.   CDAI Exam: CDAI Score: 1.8  Patient Global: 4 mm; Provider Global: 4 mm Swollen: 0 ; Tender: 1  Joint Exam 11/02/2021      Right  Left  Glenohumeral   Tender        Investigation: No additional findings.  Imaging: XR Shoulder Right  Result Date: 11/02/2021 No glenohumeral joint space narrowing was noted.  Acromioclavicular narrowing was noted.  No chondrocalcinosis was noted. 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   Recent Labs: Lab Results  Component Value Date  WBC 3.9 08/03/2021   HGB 12.8 08/03/2021   PLT 283 08/03/2021   NA 140 08/03/2021   K 4.6 08/03/2021   CL 105 08/03/2021   CO2 27 08/03/2021   GLUCOSE 98 08/03/2021   BUN 14 08/03/2021   CREATININE 0.84 08/03/2021   BILITOT 0.3 08/03/2021   ALKPHOS 98 02/16/2017   AST 23 08/03/2021   ALT 23 08/03/2021   PROT 6.3 08/03/2021   ALBUMIN 3.8 02/16/2017   CALCIUM 9.4 08/03/2021   GFRAA 64 09/29/2020    Speciality Comments: PLQ Eye Exam::10/25/2021 WNL @ [facility] Follow up in 1 year Osteoporosis managed by another provider.   Procedures:  Large Joint Inj on 11/02/2021 1:59 PM Indications: pain Details: 27 G 1.5 in needle, posterior approach  Arthrogram: No  Medications: 1 mL lidocaine 1 %; 40 mg triamcinolone acetonide 40 MG/ML Aspirate: 0 mL Outcome: tolerated well, no immediate complications Procedure, treatment alternatives, risks and benefits explained, specific risks discussed. Consent was given  by the patient. Immediately prior to procedure a time out was called to verify the correct patient, procedure, equipment, support staff and site/side marked as required. Patient was prepped and draped in the usual sterile fashion.    Allergies: Patient has no known allergies.   Assessment / Plan:     Visit Diagnoses: Rheumatoid arthritis involving multiple sites with positive rheumatoid factor ([place]): She has no synovitis on examination today.  She has not had any signs or symptoms consistent with a rheumatoid arthritis flare recently.  She is clinically doing well taking Plaquenil 200 mg 1 tablet by mouth twice daily Monday through Friday.  She continues to tolerate Plaquenil without any side effects and has not missed any doses recently.  She presents today with increased pain in the right shoulder joint.  She has not had any recent injury or fall.  X-rays of the right shoulder were updated today.  She requested a right shoulder joint cortisone injection.  She tolerated the procedure well.  Procedure note was completed above.  She plans on continuing to take Percocet every 4 hours as needed for pain relief.  She was advised to notify us if her symptoms persist or worsen.  She will remain on Plaquenil as prescribed.  She will follow-up in the office in 5 months.  High risk medications (not anticoagulants) long-term use - Plaquenil 200 mg 1 tablet by mouth twice daily Monday through Friday only.  A refill of Plaquenil was sent to the pharmacy today.  CBC and CMP WNL on 08/03/21.  CBC and CMP can be updated today.  Orders were released.  She will continue to require updated lab work every 5 months to monitor for drug toxicity. PLQ Eye Exam:10/25/2021 WNL @ [facility] Follow up in 1 year. Plan: CBC with Differential/Platelet, COMPLETE METABOLIC PANEL WITH GFR  Elevated serum creatinine: CMP WNL on 08/03/21.    Closed fracture of right wrist, sequela: Healed.  No tenderness upon palpation of the right  wrist.  History of bilateral total hip arthroplasty: Doing well.  She has slightly limited range of motion of both hip joints but no groin pain currently.  History of total bilateral knee replacement: Doing well.  She has good range of motion of both knee replacements with no warmth or effusion.  DDD (degenerative disc disease), cervical: She has good range of motion of the C-spine with no discomfort at this time.  No symptoms of radiculopathy.  DDD (degenerative disc disease), lumbar: Chronic  pain.  She continues to experience nocturnal pain.  No symptoms of radiculopathy at this time.  She takes Percocet every 4 hours as needed for pain relief.  Fibromyalgia: She has generalized hyperalgesia and positive tender points on examination.  She has been experiencing increased myalgias and muscle tenderness due to fibromyalgia.  She is taking Percocet every 4 hours as needed for pain relief and takes gabapentin 100 mg to 200 mg at bedtime.  Discussed the importance of regular exercise and good sleep hygiene.  Primary insomnia: She takes gabapentin 100 mg to 200 mg at bedtime.  She continues to experience nocturnal pain.   Other fatigue: Chronic, stable.  Discussed the importance of regular exercise.   Chronic right shoulder pain -She presents today with increased pain in the right shoulder joint which started several months ago.  She has not had any recent injury or fall.  She experiences significant discomfort with certain positions especially when lying on her right side at night.  X-rays of the right shoulder were obtained today.  She requested a right shoulder joint cortisone injection after informed consent was provided.  She tolerated the procedure well.  Procedure note was completed above.  Aftercare was discussed.  She was advised to notify us if her symptoms persist or worsen.  She was given a handout of shoulder exercises to perform.  Plan: XR Shoulder Right  Age-related osteoporosis without  current pathological fracture: DXA due to be updated.  Order placed today.   Other medical conditions are listed as follows:   History of renal insufficiency  History of depression    Orders: Orders Placed This Encounter  Procedures   Large Joint Inj: R glenohumeral   Large Joint Inj   XR Shoulder Right   DG BONE DENSITY (DXA)   CBC with Differential/Platelet   COMPLETE METABOLIC PANEL WITH GFR   Meds ordered this encounter  Medications   hydroxychloroquine (PLAQUENIL) 200 MG tablet    Sig: Take 200 mg 1 tablet by mouth twice daily Monday through Friday only    Dispense:  120 tablet    Refill:  0    Follow-Up Instructions: Return in about 5 months (around 04/01/2022) for Rheumatoid arthritis, Fibromyalgia, DDD.   Ofilia Neas, PA-C  Note - This record has been created using Dragon software.  Chart creation errors have been sought, but may not always  have been located. Such creation errors do not reflect on  the standard of medical care.

## 2021-11-02 ENCOUNTER — Ambulatory Visit: Payer: Self-pay

## 2021-11-02 ENCOUNTER — Encounter: Payer: Self-pay | Admitting: Physician Assistant

## 2021-11-02 ENCOUNTER — Other Ambulatory Visit: Payer: Self-pay

## 2021-11-02 ENCOUNTER — Ambulatory Visit: Payer: Medicare Other | Admitting: Physician Assistant

## 2021-11-02 VITALS — BP 137/79 | HR 80 | Resp 13 | Ht 66.0 in | Wt 173.6 lb

## 2021-11-02 DIAGNOSIS — M5030 Other cervical disc degeneration, unspecified cervical region: Secondary | ICD-10-CM

## 2021-11-02 DIAGNOSIS — R7989 Other specified abnormal findings of blood chemistry: Secondary | ICD-10-CM | POA: Diagnosis not present

## 2021-11-02 DIAGNOSIS — M0579 Rheumatoid arthritis with rheumatoid factor of multiple sites without organ or systems involvement: Secondary | ICD-10-CM | POA: Diagnosis not present

## 2021-11-02 DIAGNOSIS — Z79899 Other long term (current) drug therapy: Secondary | ICD-10-CM

## 2021-11-02 DIAGNOSIS — M5136 Other intervertebral disc degeneration, lumbar region: Secondary | ICD-10-CM

## 2021-11-02 DIAGNOSIS — G8929 Other chronic pain: Secondary | ICD-10-CM

## 2021-11-02 DIAGNOSIS — S62101S Fracture of unspecified carpal bone, right wrist, sequela: Secondary | ICD-10-CM | POA: Diagnosis not present

## 2021-11-02 DIAGNOSIS — Z87448 Personal history of other diseases of urinary system: Secondary | ICD-10-CM

## 2021-11-02 DIAGNOSIS — Z8659 Personal history of other mental and behavioral disorders: Secondary | ICD-10-CM

## 2021-11-02 DIAGNOSIS — M810 Age-related osteoporosis without current pathological fracture: Secondary | ICD-10-CM

## 2021-11-02 DIAGNOSIS — Z96643 Presence of artificial hip joint, bilateral: Secondary | ICD-10-CM

## 2021-11-02 DIAGNOSIS — Z96653 Presence of artificial knee joint, bilateral: Secondary | ICD-10-CM

## 2021-11-02 DIAGNOSIS — F5101 Primary insomnia: Secondary | ICD-10-CM

## 2021-11-02 DIAGNOSIS — M51369 Other intervertebral disc degeneration, lumbar region without mention of lumbar back pain or lower extremity pain: Secondary | ICD-10-CM

## 2021-11-02 DIAGNOSIS — R5383 Other fatigue: Secondary | ICD-10-CM

## 2021-11-02 DIAGNOSIS — M797 Fibromyalgia: Secondary | ICD-10-CM

## 2021-11-02 DIAGNOSIS — M25511 Pain in right shoulder: Secondary | ICD-10-CM | POA: Diagnosis not present

## 2021-11-02 LAB — CBC WITH DIFFERENTIAL/PLATELET
Absolute Monocytes: 653 cells/uL (ref 200–950)
Basophils Absolute: 38 cells/uL (ref 0–200)
Basophils Relative: 0.7 %
Eosinophils Absolute: 140 cells/uL (ref 15–500)
Eosinophils Relative: 2.6 %
HCT: 36.7 % (ref 35.0–45.0)
Hemoglobin: 12 g/dL (ref 11.7–15.5)
Lymphs Abs: 1474 cells/uL (ref 850–3900)
MCH: 31.4 pg (ref 27.0–33.0)
MCHC: 32.7 g/dL (ref 32.0–36.0)
MCV: 96.1 fL (ref 80.0–100.0)
MPV: 9.9 fL (ref 7.5–12.5)
Monocytes Relative: 12.1 %
Neutro Abs: 3094 cells/uL (ref 1500–7800)
Neutrophils Relative %: 57.3 %
Platelets: 285 10*3/uL (ref 140–400)
RBC: 3.82 10*6/uL (ref 3.80–5.10)
RDW: 12.6 % (ref 11.0–15.0)
Total Lymphocyte: 27.3 %
WBC: 5.4 10*3/uL (ref 3.8–10.8)

## 2021-11-02 LAB — COMPLETE METABOLIC PANEL WITH GFR
AG Ratio: 1.7 (calc) (ref 1.0–2.5)
ALT: 41 U/L — ABNORMAL HIGH (ref 6–29)
AST: 30 U/L (ref 10–35)
Albumin: 4 g/dL (ref 3.6–5.1)
Alkaline phosphatase (APISO): 107 U/L (ref 37–153)
BUN: 19 mg/dL (ref 7–25)
CO2: 26 mmol/L (ref 20–32)
Calcium: 8.7 mg/dL (ref 8.6–10.4)
Chloride: 107 mmol/L (ref 98–110)
Creat: 0.96 mg/dL (ref 0.50–1.05)
Globulin: 2.3 g/dL (calc) (ref 1.9–3.7)
Glucose, Bld: 87 mg/dL (ref 65–99)
Potassium: 4.8 mmol/L (ref 3.5–5.3)
Sodium: 140 mmol/L (ref 135–146)
Total Bilirubin: 0.3 mg/dL (ref 0.2–1.2)
Total Protein: 6.3 g/dL (ref 6.1–8.1)
eGFR: 64 mL/min/{1.73_m2} (ref >=60)

## 2021-11-02 MED ORDER — LIDOCAINE HCL 1 % IJ SOLN
1.0000 mL | INTRAMUSCULAR | Status: AC | PRN
  Administered 2021-11-02: 1 mL

## 2021-11-02 MED ORDER — HYDROXYCHLOROQUINE SULFATE 200 MG PO TABS: ORAL_TABLET | ORAL | 0 refills | Status: DC

## 2021-11-02 MED ORDER — TRIAMCINOLONE ACETONIDE 40 MG/ML IJ SUSP
40.0000 mg | INTRAMUSCULAR | Status: AC | PRN
  Administered 2021-11-02: 40 mg via INTRA_ARTICULAR

## 2021-11-02 NOTE — Patient Instructions (Signed)
Shoulder Exercises Ask your health care provider which exercises are safe for you. Do exercises exactly as told by your health care provider and adjust them as directed. It is normal to feel mild stretching, pulling, tightness, or discomfort as you do these exercises. Stop right away if you feel sudden pain or your pain gets worse. Do not begin these exercises until told by your health care provider. Stretching exercises External rotation and abduction This exercise is sometimes called corner stretch. This exercise rotates your arm outward (external rotation) and moves your arm out from your body (abduction). Stand in a doorway with one of your feet slightly in front of the other. This is called a staggered stance. If you cannot reach your forearms to the door frame, stand facing a corner of a room. Choose one of the following positions as told by your health care provider: Place your hands and forearms on the door frame above your head. Place your hands and forearms on the door frame at the height of your head. Place your hands on the door frame at the height of your elbows. Slowly move your weight onto your front foot until you feel a stretch across your chest and in the front of your shoulders. Keep your head and chest upright and keep your abdominal muscles tight. Hold for __________ seconds. To release the stretch, shift your weight to your back foot. Repeat __________ times. Complete this exercise __________ times a day. Extension, standing Stand and hold a broomstick, a cane, or a similar object behind your back. Your hands should be a little wider than shoulder width apart. Your palms should face away from your back. Keeping your elbows straight and your shoulder muscles relaxed, move the stick away from your body until you feel a stretch in your shoulders (extension). Avoid shrugging your shoulders while you move the stick. Keep your shoulder blades tucked down toward the middle of your  back. Hold for __________ seconds. Slowly return to the starting position. Repeat __________ times. Complete this exercise __________ times a day. Range-of-motion exercises Pendulum  Stand near a wall or a surface that you can hold onto for balance. Bend at the waist and let your left / right arm hang straight down. Use your other arm to support you. Keep your back straight and do not lock your knees. Relax your left / right arm and shoulder muscles, and move your hips and your trunk so your left / right arm swings freely. Your arm should swing because of the motion of your body, not because you are using your arm or shoulder muscles. Keep moving your hips and trunk so your arm swings in the following directions, as told by your health care provider: Side to side. Forward and backward. In clockwise and counterclockwise circles. Continue each motion for __________ seconds, or for as long as told by your health care provider. Slowly return to the starting position. Repeat __________ times. Complete this exercise __________ times a day. Shoulder flexion, standing  Stand and hold a broomstick, a cane, or a similar object. Place your hands a little more than shoulder width apart on the object. Your left / right hand should be palm up, and your other hand should be palm down. Keep your elbow straight and your shoulder muscles relaxed. Push the stick up with your healthy arm to raise your left / right arm in front of your body, and then over your head until you feel a stretch in your shoulder (flexion). Avoid   shrugging your shoulder while you raise your arm. Keep your shoulder blade tucked down toward the middle of your back. Hold for __________ seconds. Slowly return to the starting position. Repeat __________ times. Complete this exercise __________ times a day. Shoulder abduction, standing Stand and hold a broomstick, a cane, or a similar object. Place your hands a little more than shoulder  width apart on the object. Your left / right hand should be palm up, and your other hand should be palm down. Keep your elbow straight and your shoulder muscles relaxed. Push the object across your body toward your left / right side. Raise your left / right arm to the side of your body (abduction) until you feel a stretch in your shoulder. Do not raise your arm above shoulder height unless your health care provider tells you to do that. If directed, raise your arm over your head. Avoid shrugging your shoulder while you raise your arm. Keep your shoulder blade tucked down toward the middle of your back. Hold for __________ seconds. Slowly return to the starting position. Repeat __________ times. Complete this exercise __________ times a day. Internal rotation  Place your left / right hand behind your back, palm up. Use your other hand to dangle an exercise band, a towel, or a similar object over your shoulder. Grasp the band with your left / right hand so you are holding on to both ends. Gently pull up on the band until you feel a stretch in the front of your left / right shoulder. The movement of your arm toward the center of your body is called internal rotation. Avoid shrugging your shoulder while you raise your arm. Keep your shoulder blade tucked down toward the middle of your back. Hold for __________ seconds. Release the stretch by letting go of the band and lowering your hands. Repeat __________ times. Complete this exercise __________ times a day. Strengthening exercises External rotation  Sit in a stable chair without armrests. Secure an exercise band to a stable object at elbow height on your left / right side. Place a soft object, such as a folded towel or a small pillow, between your left / right upper arm and your body to move your elbow about 4 inches (10 cm) away from your side. Hold the end of the exercise band so it is tight and there is no slack. Keeping your elbow pressed  against the soft object, slowly move your forearm out, away from your abdomen (external rotation). Keep your body steady so only your forearm moves. Hold for __________ seconds. Slowly return to the starting position. Repeat __________ times. Complete this exercise __________ times a day. Shoulder abduction  Sit in a stable chair without armrests, or stand up. Hold a __________ weight in your left / right hand, or hold an exercise band with both hands. Start with your arms straight down and your left / right palm facing in, toward your body. Slowly lift your left / right hand out to your side (abduction). Do not lift your hand above shoulder height unless your health care provider tells you that this is safe. Keep your arms straight. Avoid shrugging your shoulder while you do this movement. Keep your shoulder blade tucked down toward the middle of your back. Hold for __________ seconds. Slowly lower your arm, and return to the starting position. Repeat __________ times. Complete this exercise __________ times a day. Shoulder extension Sit in a stable chair without armrests, or stand up. Secure an exercise band   to a stable object in front of you so it is at shoulder height. Hold one end of the exercise band in each hand. Your palms should face each other. Straighten your elbows and lift your hands up to shoulder height. Step back, away from the secured end of the exercise band, until the band is tight and there is no slack. Squeeze your shoulder blades together as you pull your hands down to the sides of your thighs (extension). Stop when your hands are straight down by your sides. Do not let your hands go behind your body. Hold for __________ seconds. Slowly return to the starting position. Repeat __________ times. Complete this exercise __________ times a day. Shoulder row Sit in a stable chair without armrests, or stand up. Secure an exercise band to a stable object in front of you so it  is at waist height. Hold one end of the exercise band in each hand. Position your palms so that your thumbs are facing the ceiling (neutral position). Bend each of your elbows to a 90-degree angle (right angle) and keep your upper arms at your sides. Step back until the band is tight and there is no slack. Slowly pull your elbows back behind you. Hold for __________ seconds. Slowly return to the starting position. Repeat __________ times. Complete this exercise __________ times a day. Shoulder press-ups  Sit in a stable chair that has armrests. Sit upright, with your feet flat on the floor. Put your hands on the armrests so your elbows are bent and your fingers are pointing forward. Your hands should be about even with the sides of your body. Push down on the armrests and use your arms to lift yourself off the chair. Straighten your elbows and lift yourself up as much as you comfortably can. Move your shoulder blades down, and avoid letting your shoulders move up toward your ears. Keep your feet on the ground. As you get stronger, your feet should support less of your body weight as you lift yourself up. Hold for __________ seconds. Slowly lower yourself back into the chair. Repeat __________ times. Complete this exercise __________ times a day. Wall push-ups  Stand so you are facing a stable wall. Your feet should be about one arm-length away from the wall. Lean forward and place your palms on the wall at shoulder height. Keep your feet flat on the floor as you bend your elbows and lean forward toward the wall. Hold for __________ seconds. Straighten your elbows to push yourself back to the starting position. Repeat __________ times. Complete this exercise __________ times a day. This information is not intended to replace advice given to you by your health care provider. Make sure you discuss any questions you have with your healthcare provider. Document Revised: 01/03/2019 Document  Reviewed: 10/11/2018 Elsevier Patient Education  2022 [employer]  

## 2021-11-03 NOTE — Progress Notes (Signed)
ALT is borderline elevated-41. AST WNL.  Rest of CMP WNL.  CBC WNL.  Please advise the patient to try to avoid the use of tylenol and alcohol use.

## 2021-12-25 ENCOUNTER — Other Ambulatory Visit: Payer: Self-pay | Admitting: Physician Assistant

## 2021-12-26 NOTE — Telephone Encounter (Signed)
Next Visit: 04/05/2022 ? ?Last Visit: 11/02/2021 ? ?Labs: 11/02/2021, ALT is borderline elevated-41. AST WNL.  Rest of CMP WNL.  CBC WNL.  Please advise the patient to try to avoid the use of tylenol and alcohol use. ? ?Eye exam: 10/25/2021 WNL ? ?Current Dose per office note 11/02/2021: Plaquenil 200 mg 1 tablet by mouth twice daily Monday through Friday ? ?DX: Rheumatoid arthritis involving multiple sites with positive rheumatoid factor  ? ?Last Fill: 11/02/2021  ? ?Okay to refill Plaquenil? ? ?

## 2022-02-16 ENCOUNTER — Ambulatory Visit (INDEPENDENT_AMBULATORY_CARE_PROVIDER_SITE_OTHER): Payer: Medicare Other

## 2022-02-16 ENCOUNTER — Encounter: Payer: Self-pay | Admitting: Orthopaedic Surgery

## 2022-02-16 ENCOUNTER — Ambulatory Visit: Payer: Medicare Other | Admitting: Orthopaedic Surgery

## 2022-02-16 DIAGNOSIS — M5136 Other intervertebral disc degeneration, lumbar region: Secondary | ICD-10-CM

## 2022-02-16 DIAGNOSIS — M25551 Pain in right hip: Secondary | ICD-10-CM

## 2022-02-16 DIAGNOSIS — Z96643 Presence of artificial hip joint, bilateral: Secondary | ICD-10-CM

## 2022-02-16 NOTE — Progress Notes (Signed)
Office Visit Note   Patient: Tammie [name]           Date of Birth: Aug 16, 1951           MRN: [id number] Visit Date: 02/16/2022              Requested by: Amelia Jo, FNP [address] [place],  [place] [address] PCP: Amelia Jo, FNP   Assessment & Plan: Visit Diagnoses:  1. Pain in right hip     Plan: Pleasant 71 year old woman with history of rheumatoid arthritis.  She has a history of degenerative back disease she is also had bilateral hip replacements.  On the left she has had a revision .On the right she believes that this hip replacement is 48 to 70 years old.  She does receive injections for her back.  And is treated for this elsewhere.  She presents today because her spine doctor was concerned that she may have be having something going on with her right hip .she denies any injuries.  She says she occasionally gets some pain in her right groin but its not consistent that radiates down her anterior thigh.  She denies any radicular symptoms from her back although she gets posterior buttock pain .she has no numbness tingling or weakness .we explained to her that she does have poly wear in her right hip that could account for her occasional groin pain.  At some point she may want to have the liner changed out but would not recommend doing this until she has consistent pain or she feels the pain is too difficult to tolerate.  She walks without a limp. she also has lower back pain which should be continued to be treated with injections.  All questions were answered  Follow-Up Instructions: No follow-ups on file.   Orders:  Orders Placed This Encounter  Procedures   XR Lumbar Spine 2-3 Views   XR HIP UNILAT W OR W/O PELVIS 2-3 VIEWS RIGHT   No orders of the defined types were placed in this encounter.     Procedures: No procedures performed   Clinical Data: No additional findings.   Subjective: Chief Complaint  Patient presents with   Right Hip -  Pain  Patient presents today for right hip pain. She said that her pain is located in her buttock area. The pain is getting worse with time. No down her legs. She said that her right leg feels heavy. She also has complaints of lower back pain. She takes Tylenol for pain relief. She has a history of right total hip arthroplasty.  Has had bilateral total hip replacements with revision on the left.  The right hip was replaced 20 to 30 years ago and really has not been a problem.  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  He was concerned that some of her pain may be radiating from her hip rather than from her back and thus referred her to the office.  Has been followed in rheumatology for rheumatoid arthritis and is on Plaquenil and gabapentin for fibromyalgia  HPI  Review of Systems  All other systems reviewed and are negative.   Objective: Vital Signs: There were no vitals taken for this visit.  Physical Exam Vitals reviewed.  Pulmonary:     Effort: Pulmonary effort is normal.  Skin:    General: Skin is warm and dry.  Psychiatric:  Mood and Affect: Mood normal.        Behavior: Behavior normal.    Ortho Exam Examination she has some mild tenderness in her lower back.  Strength is 5 out of 5 no weakness.  She her gait is not antalgic.  Her right hip she has internal/external rotation without any pain.  No swelling no redness.  Motion of her right hip is similar to that on the left.  She is about quarter inch longer on the right Specialty Comments:  No specialty comments available.  Imaging: No results found.   PMFS History: Patient Active Problem List   Diagnosis Date Noted   DDD (degenerative disc disease), lumbar 02/08/2017   DDD (degenerative disc disease), cervical 02/08/2017   Primary osteoarthritis of both hips 02/08/2017   Bilateral primary osteoarthritis of knee 02/08/2017    History of renal insufficiency 02/08/2017   Plantar fasciitis 02/08/2017   History of bilateral total hip arthroplasty 02/08/2017   History of total knee replacement 02/08/2017   History of depression 02/08/2017   Trapezius muscle spasm 07/18/2016   Bilateral sacroiliitis (HCC) 07/18/2016   Rheumatoid arthritis involving multiple sites with positive rheumatoid factor (HCC) 07/17/2016   High risk medications (not anticoagulants) long-term use 07/17/2016   Fibromyalgia 07/17/2016   Fatigue 07/17/2016   Insomnia 07/17/2016   Past Medical History:  Diagnosis Date   CRI (chronic renal insufficiency)    DDD (degenerative disc disease), cervical    DDD (degenerative disc disease), lumbar    Depression    Fibromyalgia    OA (osteoarthritis) of knee    Osteoarthritis of hip    Plantar fasciitis, bilateral    Rheumatoid arthritis (HCC)     No family history on file.  Past Surgical History:  Procedure Laterality Date   ANKLE FRACTURE SURGERY     APPENDECTOMY     CESAREAN SECTION     x2   CHOLECYSTECTOMY     ELBOW SURGERY     JOINT REPLACEMENT     BIL knee    JOINT REPLACEMENT     BIL hip   KNEE ARTHROPLASTY     NECK SURGERY     REPLACEMENT TOTAL KNEE Bilateral    TOTAL HIP ARTHROPLASTY Bilateral    WRIST SURGERY Right    Social History   Occupational History   Not on file  Tobacco Use   Smoking status: Never   Smokeless tobacco: Never  Vaping Use   Vaping Use: Never used  Substance and Sexual Activity   Alcohol use: No   Drug use: No   Sexual activity: Not Currently    Birth control/protection: Abstinence

## 2022-02-21 ENCOUNTER — Other Ambulatory Visit: Payer: Self-pay | Admitting: Physician Assistant

## 2022-03-29 NOTE — Progress Notes (Deleted)
Office Visit Note  Patient: Tammie [name]             Date of Birth: [birth year]           MRN: [id number]             PCP: Amelia Jo, FNP Referring: Amelia Jo, FNP Visit Date: 04/05/2022 Occupation: @GUAROCC @  Subjective:  No chief complaint on file.   History of Present Illness: Tammie [name] is a 71 y.o. female ***   Activities of Daily Living:  Patient reports morning stiffness for *** {minute/hour:19697}.   Patient {ACTIONS;DENIES/REPORTS:21021675::"Denies"} nocturnal pain.  Difficulty dressing/grooming: {ACTIONS;DENIES/REPORTS:21021675::"Denies"} Difficulty climbing stairs: {ACTIONS;DENIES/REPORTS:21021675::"Denies"} Difficulty getting out of chair: {ACTIONS;DENIES/REPORTS:21021675::"Denies"} Difficulty using hands for taps, buttons, cutlery, and/or writing: {ACTIONS;DENIES/REPORTS:21021675::"Denies"}  No Rheumatology ROS completed.   PMFS History:  Patient Active Problem List   Diagnosis Date Noted   DDD (degenerative disc disease), lumbar 02/08/2017   DDD (degenerative disc disease), cervical 02/08/2017   Primary osteoarthritis of both hips 02/08/2017   Bilateral primary osteoarthritis of knee 02/08/2017   History of renal insufficiency 02/08/2017   Plantar fasciitis 02/08/2017   History of bilateral total hip arthroplasty 02/08/2017   History of total knee replacement 02/08/2017   History of depression 02/08/2017   Trapezius muscle spasm 07/18/2016   Bilateral sacroiliitis (HCC) 07/18/2016   Rheumatoid arthritis involving multiple sites with positive rheumatoid factor (HCC) 07/17/2016   High risk medications (not anticoagulants) long-term use 07/17/2016   Fibromyalgia 07/17/2016   Fatigue 07/17/2016   Insomnia 07/17/2016    Past Medical History:  Diagnosis Date   CRI (chronic renal insufficiency)    DDD (degenerative disc disease), cervical    DDD (degenerative disc disease), lumbar    Depression    Fibromyalgia    OA  (osteoarthritis) of knee    Osteoarthritis of hip    Plantar fasciitis, bilateral    Rheumatoid arthritis (HCC)     No family history on file. Past Surgical History:  Procedure Laterality Date   ANKLE FRACTURE SURGERY     APPENDECTOMY     CESAREAN SECTION     x2   CHOLECYSTECTOMY     ELBOW SURGERY     JOINT REPLACEMENT     BIL knee    JOINT REPLACEMENT     BIL hip   KNEE ARTHROPLASTY     NECK SURGERY     REPLACEMENT TOTAL KNEE Bilateral    TOTAL HIP ARTHROPLASTY Bilateral    WRIST SURGERY Right    Social History   Social History Narrative   Not on file   Immunization History  Administered Date(s) Administered   Influenza,inj,Quad PF,6+ Mos 07/13/2016   Influenza-Unspecified 07/13/2016   Pneumococcal Conjugate-13 05/29/2017     Objective: Vital Signs: There were no vitals taken for this visit.   Physical Exam   Musculoskeletal Exam: ***  CDAI Exam: CDAI Score: -- Patient Global: --; Provider Global: -- Swollen: --; Tender: -- Joint Exam 04/05/2022   No joint exam has been documented for this visit   There is currently no information documented on the homunculus. Go to the Rheumatology activity and complete the homunculus joint exam.  Investigation: No additional findings.  Imaging: No results found.  Recent Labs: Lab Results  Component Value Date   WBC 5.4 11/02/2021   HGB 12.0 11/02/2021   PLT 285 11/02/2021   NA 140 11/02/2021   K 4.8 11/02/2021   CL 107 11/02/2021   CO2 26 11/02/2021   GLUCOSE  87 11/02/2021   BUN 19 11/02/2021   CREATININE 0.96 11/02/2021   BILITOT 0.3 11/02/2021   ALKPHOS 98 02/16/2017   AST 30 11/02/2021   ALT 41 (H) 11/02/2021   PROT 6.3 11/02/2021   ALBUMIN 3.8 02/16/2017   CALCIUM 8.7 11/02/2021   GFRAA 64 09/29/2020    Speciality Comments: PLQ Eye Exam::10/25/2021 WNL @ [facility] Follow up in 1 year Osteoporosis managed by another provider.   Procedures:  No procedures performed Allergies: Patient  has no known allergies.   Assessment / Plan:     Visit Diagnoses: No diagnosis found.  Orders: No orders of the defined types were placed in this encounter.  No orders of the defined types were placed in this encounter.   Face-to-face time spent with patient was *** minutes. Greater than 50% of time was spent in counseling and coordination of care.  Follow-Up Instructions: No follow-ups on file.   Ellen Henri, CMA  Note - This record has been created using Animal nutritionist.  Chart creation errors have been sought, but may not always  have been located. Such creation errors do not reflect on  the standard of medical care.

## 2022-04-05 ENCOUNTER — Ambulatory Visit: Payer: Medicare Other | Admitting: Rheumatology

## 2022-04-05 DIAGNOSIS — Z96643 Presence of artificial hip joint, bilateral: Secondary | ICD-10-CM

## 2022-04-05 DIAGNOSIS — Z79899 Other long term (current) drug therapy: Secondary | ICD-10-CM

## 2022-04-05 DIAGNOSIS — M810 Age-related osteoporosis without current pathological fracture: Secondary | ICD-10-CM

## 2022-04-05 DIAGNOSIS — M0579 Rheumatoid arthritis with rheumatoid factor of multiple sites without organ or systems involvement: Secondary | ICD-10-CM

## 2022-04-05 DIAGNOSIS — S62101S Fracture of unspecified carpal bone, right wrist, sequela: Secondary | ICD-10-CM

## 2022-04-05 DIAGNOSIS — G8929 Other chronic pain: Secondary | ICD-10-CM

## 2022-04-05 DIAGNOSIS — Z87448 Personal history of other diseases of urinary system: Secondary | ICD-10-CM

## 2022-04-05 DIAGNOSIS — R5383 Other fatigue: Secondary | ICD-10-CM

## 2022-04-05 DIAGNOSIS — M5136 Other intervertebral disc degeneration, lumbar region: Secondary | ICD-10-CM

## 2022-04-05 DIAGNOSIS — M5030 Other cervical disc degeneration, unspecified cervical region: Secondary | ICD-10-CM

## 2022-04-05 DIAGNOSIS — F5101 Primary insomnia: Secondary | ICD-10-CM

## 2022-04-05 DIAGNOSIS — M797 Fibromyalgia: Secondary | ICD-10-CM

## 2022-04-05 DIAGNOSIS — Z96653 Presence of artificial knee joint, bilateral: Secondary | ICD-10-CM

## 2022-04-05 DIAGNOSIS — Z8659 Personal history of other mental and behavioral disorders: Secondary | ICD-10-CM

## 2022-04-05 DIAGNOSIS — R7989 Other specified abnormal findings of blood chemistry: Secondary | ICD-10-CM

## 2022-04-26 NOTE — Progress Notes (Deleted)
Office Visit Note  Patient: Tammie [name]             Date of Birth: Oct 12, 1950           MRN: [id number]             PCP: Amelia Jo, FNP Referring: Amelia Jo, FNP Visit Date: 05/10/2022 Occupation: @GUAROCC @  Subjective:  No chief complaint on file.   History of Present Illness: Tammie [name] is a 71 y.o. female ***   Activities of Daily Living:  Patient reports morning stiffness for *** {minute/hour:19697}.   Patient {ACTIONS;DENIES/REPORTS:21021675::"Denies"} nocturnal pain.  Difficulty dressing/grooming: {ACTIONS;DENIES/REPORTS:21021675::"Denies"} Difficulty climbing stairs: {ACTIONS;DENIES/REPORTS:21021675::"Denies"} Difficulty getting out of chair: {ACTIONS;DENIES/REPORTS:21021675::"Denies"} Difficulty using hands for taps, buttons, cutlery, and/or writing: {ACTIONS;DENIES/REPORTS:21021675::"Denies"}  No Rheumatology ROS completed.   PMFS History:  Patient Active Problem List   Diagnosis Date Noted  . DDD (degenerative disc disease), lumbar 02/08/2017  . DDD (degenerative disc disease), cervical 02/08/2017  . Primary osteoarthritis of both hips 02/08/2017  . Bilateral primary osteoarthritis of knee 02/08/2017  . History of renal insufficiency 02/08/2017  . Plantar fasciitis 02/08/2017  . History of bilateral total hip arthroplasty 02/08/2017  . History of total knee replacement 02/08/2017  . History of depression 02/08/2017  . Trapezius muscle spasm 07/18/2016  . Bilateral sacroiliitis (HCC) 07/18/2016  . Rheumatoid arthritis involving multiple sites with positive rheumatoid factor (HCC) 07/17/2016  . High risk medications (not anticoagulants) long-term use 07/17/2016  . Fibromyalgia 07/17/2016  . Fatigue 07/17/2016  . Insomnia 07/17/2016    Past Medical History:  Diagnosis Date  . CRI (chronic renal insufficiency)   . DDD (degenerative disc disease), cervical   . DDD (degenerative disc disease), lumbar   . Depression   .  Fibromyalgia   . OA (osteoarthritis) of knee   . Osteoarthritis of hip   . Plantar fasciitis, bilateral   . Rheumatoid arthritis (HCC)     No family history on file. Past Surgical History:  Procedure Laterality Date  . ANKLE FRACTURE SURGERY    . APPENDECTOMY    . CESAREAN SECTION     x2  . CHOLECYSTECTOMY    . ELBOW SURGERY    . JOINT REPLACEMENT     BIL knee   . JOINT REPLACEMENT     BIL hip  . KNEE ARTHROPLASTY    . NECK SURGERY    . REPLACEMENT TOTAL KNEE Bilateral   . TOTAL HIP ARTHROPLASTY Bilateral   . WRIST SURGERY Right    Social History   Social History Narrative  . Not on file   Immunization History  Administered Date(s) Administered  . Influenza,inj,Quad PF,6+ Mos 07/13/2016  . Influenza-Unspecified 07/13/2016  . Pneumococcal Conjugate-13 05/29/2017     Objective: Vital Signs: There were no vitals taken for this visit.   Physical Exam   Musculoskeletal Exam: ***  CDAI Exam: CDAI Score: -- Patient Global: --; Provider Global: -- Swollen: --; Tender: -- Joint Exam 05/10/2022   No joint exam has been documented for this visit   There is currently no information documented on the homunculus. Go to the Rheumatology activity and complete the homunculus joint exam.  Investigation: No additional findings.  Imaging: No results found.  Recent Labs: Lab Results  Component Value Date   WBC 5.4 11/02/2021   HGB 12.0 11/02/2021   PLT 285 11/02/2021   NA 140 11/02/2021   K 4.8 11/02/2021   CL 107 11/02/2021   CO2 26 11/02/2021   GLUCOSE  87 11/02/2021   BUN 19 11/02/2021   CREATININE 0.96 11/02/2021   BILITOT 0.3 11/02/2021   ALKPHOS 98 02/16/2017   AST 30 11/02/2021   ALT 41 (H) 11/02/2021   PROT 6.3 11/02/2021   ALBUMIN 3.8 02/16/2017   CALCIUM 8.7 11/02/2021   GFRAA 64 09/29/2020    Speciality Comments: PLQ Eye Exam::10/25/2021 WNL @ [facility] Follow up in 1 year Osteoporosis managed by another provider.   Procedures:  No  procedures performed Allergies: Patient has no known allergies.   Assessment / Plan:     Visit Diagnoses: No diagnosis found.  Orders: No orders of the defined types were placed in this encounter.  No orders of the defined types were placed in this encounter.   Face-to-face time spent with patient was *** minutes. Greater than 50% of time was spent in counseling and coordination of care.  Follow-Up Instructions: No follow-ups on file.   Earnestine Mealing, CMA  Note - This record has been created using Editor, commissioning.  Chart creation errors have been sought, but may not always  have been located. Such creation errors do not reflect on  the standard of medical care.

## 2022-05-10 ENCOUNTER — Ambulatory Visit: Payer: Medicare Other | Attending: Rheumatology | Admitting: Rheumatology

## 2022-05-10 DIAGNOSIS — F5101 Primary insomnia: Secondary | ICD-10-CM

## 2022-05-10 DIAGNOSIS — M797 Fibromyalgia: Secondary | ICD-10-CM

## 2022-05-10 DIAGNOSIS — Z8659 Personal history of other mental and behavioral disorders: Secondary | ICD-10-CM

## 2022-05-10 DIAGNOSIS — M810 Age-related osteoporosis without current pathological fracture: Secondary | ICD-10-CM

## 2022-05-10 DIAGNOSIS — Z96653 Presence of artificial knee joint, bilateral: Secondary | ICD-10-CM

## 2022-05-10 DIAGNOSIS — Z87448 Personal history of other diseases of urinary system: Secondary | ICD-10-CM

## 2022-05-10 DIAGNOSIS — M0579 Rheumatoid arthritis with rheumatoid factor of multiple sites without organ or systems involvement: Secondary | ICD-10-CM

## 2022-05-10 DIAGNOSIS — Z96643 Presence of artificial hip joint, bilateral: Secondary | ICD-10-CM

## 2022-05-10 DIAGNOSIS — S62101S Fracture of unspecified carpal bone, right wrist, sequela: Secondary | ICD-10-CM

## 2022-05-10 DIAGNOSIS — R7989 Other specified abnormal findings of blood chemistry: Secondary | ICD-10-CM

## 2022-05-10 DIAGNOSIS — Z79899 Other long term (current) drug therapy: Secondary | ICD-10-CM

## 2022-05-10 DIAGNOSIS — R5383 Other fatigue: Secondary | ICD-10-CM

## 2022-05-10 DIAGNOSIS — M5136 Other intervertebral disc degeneration, lumbar region: Secondary | ICD-10-CM

## 2022-05-10 DIAGNOSIS — M5030 Other cervical disc degeneration, unspecified cervical region: Secondary | ICD-10-CM

## 2022-05-10 DIAGNOSIS — G8929 Other chronic pain: Secondary | ICD-10-CM

## 2022-05-10 NOTE — Progress Notes (Deleted)
Office Visit Note  Patient: Tammie [name]             Date of Birth: 14-May-1951           MRN: [id number]             PCP: Amelia Jo, FNP Referring: Amelia Jo, FNP Visit Date: 05/11/2022 Occupation: @GUAROCC @  Subjective:  No chief complaint on file.   History of Present Illness: Tammie [name] is a 71 y.o. female ***   Activities of Daily Living:  Patient reports morning stiffness for *** {minute/hour:19697}.   Patient {ACTIONS;DENIES/REPORTS:21021675::"Denies"} nocturnal pain.  Difficulty dressing/grooming: {ACTIONS;DENIES/REPORTS:21021675::"Denies"} Difficulty climbing stairs: {ACTIONS;DENIES/REPORTS:21021675::"Denies"} Difficulty getting out of chair: {ACTIONS;DENIES/REPORTS:21021675::"Denies"} Difficulty using hands for taps, buttons, cutlery, and/or writing: {ACTIONS;DENIES/REPORTS:21021675::"Denies"}  No Rheumatology ROS completed.   PMFS History:  Patient Active Problem List   Diagnosis Date Noted  . DDD (degenerative disc disease), lumbar 02/08/2017  . DDD (degenerative disc disease), cervical 02/08/2017  . Primary osteoarthritis of both hips 02/08/2017  . Bilateral primary osteoarthritis of knee 02/08/2017  . History of renal insufficiency 02/08/2017  . Plantar fasciitis 02/08/2017  . History of bilateral total hip arthroplasty 02/08/2017  . History of total knee replacement 02/08/2017  . History of depression 02/08/2017  . Trapezius muscle spasm 07/18/2016  . Bilateral sacroiliitis (HCC) 07/18/2016  . Rheumatoid arthritis involving multiple sites with positive rheumatoid factor (HCC) 07/17/2016  . High risk medications (not anticoagulants) long-term use 07/17/2016  . Fibromyalgia 07/17/2016  . Fatigue 07/17/2016  . Insomnia 07/17/2016    Past Medical History:  Diagnosis Date  . CRI (chronic renal insufficiency)   . DDD (degenerative disc disease), cervical   . DDD (degenerative disc disease), lumbar   . Depression   .  Fibromyalgia   . OA (osteoarthritis) of knee   . Osteoarthritis of hip   . Plantar fasciitis, bilateral   . Rheumatoid arthritis (HCC)     No family history on file. Past Surgical History:  Procedure Laterality Date  . ANKLE FRACTURE SURGERY    . APPENDECTOMY    . CESAREAN SECTION     x2  . CHOLECYSTECTOMY    . ELBOW SURGERY    . JOINT REPLACEMENT     BIL knee   . JOINT REPLACEMENT     BIL hip  . KNEE ARTHROPLASTY    . NECK SURGERY    . REPLACEMENT TOTAL KNEE Bilateral   . TOTAL HIP ARTHROPLASTY Bilateral   . WRIST SURGERY Right    Social History   Social History Narrative  . Not on file   Immunization History  Administered Date(s) Administered  . Influenza,inj,Quad PF,6+ Mos 07/13/2016  . Influenza-Unspecified 07/13/2016  . Pneumococcal Conjugate-13 05/29/2017     Objective: Vital Signs: There were no vitals taken for this visit.   Physical Exam   Musculoskeletal Exam: ***  CDAI Exam: CDAI Score: -- Patient Global: --; Provider Global: -- Swollen: --; Tender: -- Joint Exam 05/11/2022   No joint exam has been documented for this visit   There is currently no information documented on the homunculus. Go to the Rheumatology activity and complete the homunculus joint exam.  Investigation: No additional findings.  Imaging: No results found.  Recent Labs: Lab Results  Component Value Date   WBC 5.4 11/02/2021   HGB 12.0 11/02/2021   PLT 285 11/02/2021   NA 140 11/02/2021   K 4.8 11/02/2021   CL 107 11/02/2021   CO2 26 11/02/2021   GLUCOSE  87 11/02/2021   BUN 19 11/02/2021   CREATININE 0.96 11/02/2021   BILITOT 0.3 11/02/2021   ALKPHOS 98 02/16/2017   AST 30 11/02/2021   ALT 41 (H) 11/02/2021   PROT 6.3 11/02/2021   ALBUMIN 3.8 02/16/2017   CALCIUM 8.7 11/02/2021   GFRAA 64 09/29/2020    Speciality Comments: PLQ Eye Exam::10/25/2021 WNL @ [facility] Follow up in 1 year Osteoporosis managed by another provider.   Procedures:  No  procedures performed Allergies: Patient has no known allergies.   Assessment / Plan:     Visit Diagnoses: No diagnosis found.  Orders: No orders of the defined types were placed in this encounter.  No orders of the defined types were placed in this encounter.   Face-to-face time spent with patient was *** minutes. Greater than 50% of time was spent in counseling and coordination of care.  Follow-Up Instructions: No follow-ups on file.   Earnestine Mealing, CMA  Note - This record has been created using Editor, commissioning.  Chart creation errors have been sought, but may not always  have been located. Such creation errors do not reflect on  the standard of medical care.

## 2022-05-11 ENCOUNTER — Ambulatory Visit: Payer: Medicare Other | Admitting: Rheumatology

## 2022-05-11 DIAGNOSIS — G8929 Other chronic pain: Secondary | ICD-10-CM

## 2022-05-11 DIAGNOSIS — Z79899 Other long term (current) drug therapy: Secondary | ICD-10-CM

## 2022-05-11 DIAGNOSIS — Z8659 Personal history of other mental and behavioral disorders: Secondary | ICD-10-CM

## 2022-05-11 DIAGNOSIS — R5383 Other fatigue: Secondary | ICD-10-CM

## 2022-05-11 DIAGNOSIS — M5136 Other intervertebral disc degeneration, lumbar region: Secondary | ICD-10-CM

## 2022-05-11 DIAGNOSIS — Z96643 Presence of artificial hip joint, bilateral: Secondary | ICD-10-CM

## 2022-05-11 DIAGNOSIS — M0579 Rheumatoid arthritis with rheumatoid factor of multiple sites without organ or systems involvement: Secondary | ICD-10-CM

## 2022-05-11 DIAGNOSIS — M810 Age-related osteoporosis without current pathological fracture: Secondary | ICD-10-CM

## 2022-05-11 DIAGNOSIS — R7989 Other specified abnormal findings of blood chemistry: Secondary | ICD-10-CM

## 2022-05-11 DIAGNOSIS — S62101S Fracture of unspecified carpal bone, right wrist, sequela: Secondary | ICD-10-CM

## 2022-05-11 DIAGNOSIS — F5101 Primary insomnia: Secondary | ICD-10-CM

## 2022-05-11 DIAGNOSIS — Z96653 Presence of artificial knee joint, bilateral: Secondary | ICD-10-CM

## 2022-05-11 DIAGNOSIS — M5030 Other cervical disc degeneration, unspecified cervical region: Secondary | ICD-10-CM

## 2022-05-11 DIAGNOSIS — Z87448 Personal history of other diseases of urinary system: Secondary | ICD-10-CM

## 2022-05-11 DIAGNOSIS — M797 Fibromyalgia: Secondary | ICD-10-CM

## 2022-06-11 ENCOUNTER — Other Ambulatory Visit: Payer: Self-pay | Admitting: Physician Assistant

## 2022-06-12 NOTE — Telephone Encounter (Signed)
Received Rx from 2 pharmacies. Reached out to patient to verify which is correct. Left message with husband for patient to call office. Patient also due for a follow-up visit.

## 2022-06-12 NOTE — Telephone Encounter (Signed)
Received Rx from 2 pharmacies. Reached out to patient to verify which is correct. Left message with husband for patient to call office. Patient also due for a follow-up visit. 

## 2022-06-13 NOTE — Progress Notes (Unsigned)
Office Visit Note  Patient: Tammie [name]             Date of Birth: December 05, 1950           MRN: [id number]             PCP: Henderson Baltimore, FNP Referring: Henderson Baltimore, FNP Visit Date: 06/15/2022 Occupation: @GUAROCC @  Subjective:  No chief complaint on file.   History of Present Illness: Tammie [name] is a 71 y.o. female ***   Activities of Daily Living:  Patient reports morning stiffness for *** {minute/hour:19697}.   Patient {ACTIONS;DENIES/REPORTS:21021675::"Denies"} nocturnal pain.  Difficulty dressing/grooming: {ACTIONS;DENIES/REPORTS:21021675::"Denies"} Difficulty climbing stairs: {ACTIONS;DENIES/REPORTS:21021675::"Denies"} Difficulty getting out of chair: {ACTIONS;DENIES/REPORTS:21021675::"Denies"} Difficulty using hands for taps, buttons, cutlery, and/or writing: {ACTIONS;DENIES/REPORTS:21021675::"Denies"}  No Rheumatology ROS completed.   PMFS History:  Patient Active Problem List   Diagnosis Date Noted  . DDD (degenerative disc disease), lumbar 02/08/2017  . DDD (degenerative disc disease), cervical 02/08/2017  . Primary osteoarthritis of both hips 02/08/2017  . Bilateral primary osteoarthritis of knee 02/08/2017  . History of renal insufficiency 02/08/2017  . Plantar fasciitis 02/08/2017  . History of bilateral total hip arthroplasty 02/08/2017  . History of total knee replacement 02/08/2017  . History of depression 02/08/2017  . Trapezius muscle spasm 07/18/2016  . Bilateral sacroiliitis ([place]) 07/18/2016  . Rheumatoid arthritis involving multiple sites with positive rheumatoid factor ([place]) 07/17/2016  . High risk medications (not anticoagulants) long-term use 07/17/2016  . Fibromyalgia 07/17/2016  . Fatigue 07/17/2016  . Insomnia 07/17/2016    Past Medical History:  Diagnosis Date  . CRI (chronic renal insufficiency)   . DDD (degenerative disc disease), cervical   . DDD (degenerative disc disease), lumbar   . Depression   .  Fibromyalgia   . OA (osteoarthritis) of knee   . Osteoarthritis of hip   . Plantar fasciitis, bilateral   . Rheumatoid arthritis ([place])     No family history on file. Past Surgical History:  Procedure Laterality Date  . ANKLE FRACTURE SURGERY    . APPENDECTOMY    . CESAREAN SECTION     x2  . CHOLECYSTECTOMY    . ELBOW SURGERY    . JOINT REPLACEMENT     BIL knee   . JOINT REPLACEMENT     BIL hip  . KNEE ARTHROPLASTY    . NECK SURGERY    . REPLACEMENT TOTAL KNEE Bilateral   . TOTAL HIP ARTHROPLASTY Bilateral   . WRIST SURGERY Right    Social History   Social History Narrative  . Not on file   Immunization History  Administered Date(s) Administered  . Influenza,inj,Quad PF,6+ Mos 07/13/2016  . Influenza-Unspecified 07/13/2016  . Pneumococcal Conjugate-13 05/29/2017     Objective: Vital Signs: There were no vitals taken for this visit.   Physical Exam   Musculoskeletal Exam: ***  CDAI Exam: CDAI Score: -- Patient Global: --; Provider Global: -- Swollen: --; Tender: -- Joint Exam 06/15/2022   No joint exam has been documented for this visit   There is currently no information documented on the homunculus. Go to the Rheumatology activity and complete the homunculus joint exam.  Investigation: No additional findings.  Imaging: No results found.  Recent Labs: Lab Results  Component Value Date   WBC 5.4 11/02/2021   HGB 12.0 11/02/2021   PLT 285 11/02/2021   NA 140 11/02/2021   K 4.8 11/02/2021   CL 107 11/02/2021   CO2 26 11/02/2021   GLUCOSE  87 11/02/2021   BUN 19 11/02/2021   CREATININE 0.96 11/02/2021   BILITOT 0.3 11/02/2021   ALKPHOS 98 02/16/2017   AST 30 11/02/2021   ALT 41 (H) 11/02/2021   PROT 6.3 11/02/2021   ALBUMIN 3.8 02/16/2017   CALCIUM 8.7 11/02/2021   GFRAA 64 09/29/2020    Speciality Comments: PLQ Eye Exam::10/25/2021 WNL @ [facility] Follow up in 1 year Osteoporosis managed by another provider.   Procedures:  No  procedures performed Allergies: Patient has no known allergies.   Assessment / Plan:     Visit Diagnoses: No diagnosis found.  Orders: No orders of the defined types were placed in this encounter.  No orders of the defined types were placed in this encounter.   Face-to-face time spent with patient was *** minutes. Greater than 50% of time was spent in counseling and coordination of care.  Follow-Up Instructions: No follow-ups on file.   Earnestine Mealing, CMA  Note - This record has been created using Editor, commissioning.  Chart creation errors have been sought, but may not always  have been located. Such creation errors do not reflect on  the standard of medical care.

## 2022-06-13 NOTE — Telephone Encounter (Signed)
Spoke with patient and she states she would like to have her prescription filled with [facility].   Next Visit: 06/15/2022  Last Visit:  11/02/2021  Labs: 11/02/2021, ALT is borderline elevated-41. AST WNL.  Rest of CMP WNL.  CBC WNL.  Please advise the patient to try to avoid the use of tylenol and alcohol use.  Eye exam: 10/25/2021 WNL   Current Dose per office note 11/02/2021: Plaquenil 200 mg 1 tablet by mouth twice daily Monday through Friday  OA:[id number] arthritis involving multiple sites with positive rheumatoid factor     Last Fill: 12/26/2021  Patient to update labs at upcoming appointment on 06/15/2022  Okay to refill Plaquenil?

## 2022-06-15 ENCOUNTER — Ambulatory Visit: Payer: Medicare Other | Attending: Rheumatology | Admitting: Rheumatology

## 2022-06-15 ENCOUNTER — Encounter: Payer: Self-pay | Admitting: Rheumatology

## 2022-06-15 VITALS — BP 148/71 | HR 69 | Resp 15 | Ht 66.0 in | Wt 171.0 lb

## 2022-06-15 DIAGNOSIS — M0579 Rheumatoid arthritis with rheumatoid factor of multiple sites without organ or systems involvement: Secondary | ICD-10-CM | POA: Diagnosis not present

## 2022-06-15 DIAGNOSIS — S62101S Fracture of unspecified carpal bone, right wrist, sequela: Secondary | ICD-10-CM

## 2022-06-15 DIAGNOSIS — G8929 Other chronic pain: Secondary | ICD-10-CM

## 2022-06-15 DIAGNOSIS — Z96643 Presence of artificial hip joint, bilateral: Secondary | ICD-10-CM

## 2022-06-15 DIAGNOSIS — Z79899 Other long term (current) drug therapy: Secondary | ICD-10-CM

## 2022-06-15 DIAGNOSIS — Z8659 Personal history of other mental and behavioral disorders: Secondary | ICD-10-CM

## 2022-06-15 DIAGNOSIS — R5383 Other fatigue: Secondary | ICD-10-CM

## 2022-06-15 DIAGNOSIS — R7989 Other specified abnormal findings of blood chemistry: Secondary | ICD-10-CM

## 2022-06-15 DIAGNOSIS — M5136 Other intervertebral disc degeneration, lumbar region: Secondary | ICD-10-CM

## 2022-06-15 DIAGNOSIS — Z96653 Presence of artificial knee joint, bilateral: Secondary | ICD-10-CM

## 2022-06-15 DIAGNOSIS — M797 Fibromyalgia: Secondary | ICD-10-CM

## 2022-06-15 DIAGNOSIS — M5030 Other cervical disc degeneration, unspecified cervical region: Secondary | ICD-10-CM

## 2022-06-15 DIAGNOSIS — Z87448 Personal history of other diseases of urinary system: Secondary | ICD-10-CM

## 2022-06-15 DIAGNOSIS — M25511 Pain in right shoulder: Secondary | ICD-10-CM | POA: Diagnosis not present

## 2022-06-15 DIAGNOSIS — M810 Age-related osteoporosis without current pathological fracture: Secondary | ICD-10-CM

## 2022-06-15 DIAGNOSIS — M51369 Other intervertebral disc degeneration, lumbar region without mention of lumbar back pain or lower extremity pain: Secondary | ICD-10-CM

## 2022-06-15 DIAGNOSIS — F5101 Primary insomnia: Secondary | ICD-10-CM

## 2022-06-15 NOTE — Patient Instructions (Signed)
Vaccines You are taking a medication(s) that can suppress your immune system.  The following immunizations are recommended: Flu annually Covid-19  Td/Tdap (tetanus, diphtheria, pertussis) every 10 years Pneumonia (Prevnar 15 then Pneumovax 23 at least 1 year apart.  Alternatively, can take Prevnar 20 without needing additional dose) Shingrix: 2 doses from 4 weeks to 6 months apart  Please check with your PCP to make sure you are up to date.  

## 2022-06-16 LAB — COMPLETE METABOLIC PANEL WITH GFR
AG Ratio: 1.8 (calc) (ref 1.0–2.5)
ALT: 16 U/L (ref 6–29)
AST: 20 U/L (ref 10–35)
Albumin: 4.3 g/dL (ref 3.6–5.1)
Alkaline phosphatase (APISO): 76 U/L (ref 37–153)
BUN/Creatinine Ratio: 21 (calc) (ref 6–22)
BUN: 23 mg/dL (ref 7–25)
CO2: 25 mmol/L (ref 20–32)
Calcium: 9.4 mg/dL (ref 8.6–10.4)
Chloride: 106 mmol/L (ref 98–110)
Creat: 1.07 mg/dL — ABNORMAL HIGH (ref 0.60–1.00)
Globulin: 2.4 g/dL (calc) (ref 1.9–3.7)
Glucose, Bld: 84 mg/dL (ref 65–99)
Potassium: 5.3 mmol/L (ref 3.5–5.3)
Sodium: 140 mmol/L (ref 135–146)
Total Bilirubin: 0.3 mg/dL (ref 0.2–1.2)
Total Protein: 6.7 g/dL (ref 6.1–8.1)
eGFR: 56 mL/min/{1.73_m2} — ABNORMAL LOW (ref >=60)

## 2022-06-16 LAB — CBC WITH DIFFERENTIAL/PLATELET
Absolute Monocytes: 530 cells/uL (ref 200–950)
Basophils Absolute: 41 cells/uL (ref 0–200)
Basophils Relative: 0.8 %
Eosinophils Absolute: 133 cells/uL (ref 15–500)
Eosinophils Relative: 2.6 %
HCT: 37.9 % (ref 35.0–45.0)
Hemoglobin: 12.6 g/dL (ref 11.7–15.5)
Lymphs Abs: 1255 cells/uL (ref 850–3900)
MCH: 31.7 pg (ref 27.0–33.0)
MCHC: 33.2 g/dL (ref 32.0–36.0)
MCV: 95.5 fL (ref 80.0–100.0)
MPV: 9.3 fL (ref 7.5–12.5)
Monocytes Relative: 10.4 %
Neutro Abs: 3142 cells/uL (ref 1500–7800)
Neutrophils Relative %: 61.6 %
Platelets: 288 10*3/uL (ref 140–400)
RBC: 3.97 10*6/uL (ref 3.80–5.10)
RDW: 11.5 % (ref 11.0–15.0)
Total Lymphocyte: 24.6 %
WBC: 5.1 10*3/uL (ref 3.8–10.8)

## 2022-06-16 NOTE — Progress Notes (Signed)
Creatinine is mildly elevated.  Patient should increase water intake and avoid all NSAIDs.  CBC is normal.  Please forward results to her PCP.

## 2022-07-02 ENCOUNTER — Other Ambulatory Visit: Payer: Self-pay | Admitting: Rheumatology

## 2022-07-03 NOTE — Telephone Encounter (Signed)
Next Visit: 11/15/2022  Last Visit: 06/15/2022  Labs: 06/15/2022 Creatinine is mildly elevated.  Patient should increase water intake and avoid all NSAIDs.  CBC is normal  Eye exam: 10/25/2021 WNL    Current Dose per office note 06/15/2022: Plaquenil 200 mg 1 tablet by mouth twice daily Monday through Friday only  DX: Rheumatoid arthritis involving multiple sites with positive rheumatoid factor   Last Fill: 06/13/2022 (30 day supply)  Okay to refill Plaquenil?

## 2022-09-04 ENCOUNTER — Other Ambulatory Visit: Payer: Self-pay | Admitting: Physician Assistant

## 2022-10-30 ENCOUNTER — Other Ambulatory Visit: Payer: Self-pay | Admitting: Physician Assistant

## 2022-10-30 NOTE — Telephone Encounter (Signed)
Next Visit: 11/15/2022   Last Visit: 06/15/2022   Labs: 06/15/2022 Creatinine is mildly elevated.  Patient should increase water intake and avoid all NSAIDs.  CBC is normal   Eye exam: 09/26/2022 WNL    Current Dose per office note 06/15/2022: Plaquenil 200 mg 1 tablet by mouth twice daily Monday through Friday only   DX: Rheumatoid arthritis involving multiple sites with positive rheumatoid factor    Last Fill: 07/03/2022  Patient to update labs at upcoming appointment on 11/15/2022   Okay to refill Plaquenil?

## 2022-11-02 NOTE — Progress Notes (Deleted)
Office Visit Note  Patient: Tammie [name]             Date of Birth: March 05, 1951           MRN: [id number]             PCP: Tammie Baltimore, FNP Referring: Tammie Baltimore, FNP Visit Date: 11/15/2022 Occupation: @GUAROCC$ @  Subjective:    History of Present Illness: Tammie [name] is a 72 y.o. female with history of seropositive rheumatoid arthritis, osteoarthritis, DDD, and fibromyalgia.    PLQ Eye Exam: 09/26/2022 WNL@ [facility] Follow up in 1 year.   CBC and CMP updated on 06/15/22.  Orders for CBC and CMP released today.   Activities of Daily Living:  Patient reports morning stiffness for *** {minute/hour:19697}.   Patient {ACTIONS;DENIES/REPORTS:21021675::"Denies"} nocturnal pain.  Difficulty dressing/grooming: {ACTIONS;DENIES/REPORTS:21021675::"Denies"} Difficulty climbing stairs: {ACTIONS;DENIES/REPORTS:21021675::"Denies"} Difficulty getting out of chair: {ACTIONS;DENIES/REPORTS:21021675::"Denies"} Difficulty using hands for taps, buttons, cutlery, and/or writing: {ACTIONS;DENIES/REPORTS:21021675::"Denies"}  No Rheumatology ROS completed.   PMFS History:  Patient Active Problem List   Diagnosis Date Noted   DDD (degenerative disc disease), lumbar 02/08/2017   DDD (degenerative disc disease), cervical 02/08/2017   Primary osteoarthritis of both hips 02/08/2017   Bilateral primary osteoarthritis of knee 02/08/2017   History of renal insufficiency 02/08/2017   Plantar fasciitis 02/08/2017   History of bilateral total hip arthroplasty 02/08/2017   History of total knee replacement 02/08/2017   History of depression 02/08/2017   Trapezius muscle spasm 07/18/2016   Bilateral sacroiliitis ([place]) 07/18/2016   Rheumatoid arthritis involving multiple sites with positive rheumatoid factor ([place]) 07/17/2016   High risk medications (not anticoagulants) long-term use 07/17/2016   Fibromyalgia 07/17/2016   Fatigue 07/17/2016   Insomnia 07/17/2016    Past  Medical History:  Diagnosis Date   CRI (chronic renal insufficiency)    DDD (degenerative disc disease), cervical    DDD (degenerative disc disease), lumbar    Depression    Fibromyalgia    OA (osteoarthritis) of knee    Osteoarthritis of hip    Plantar fasciitis, bilateral    Rheumatoid arthritis ([place])     No family history on file. Past Surgical History:  Procedure Laterality Date   ANKLE FRACTURE SURGERY     APPENDECTOMY     CESAREAN SECTION     x2   CHOLECYSTECTOMY     ELBOW SURGERY     JOINT REPLACEMENT     BIL knee    JOINT REPLACEMENT     BIL hip   KNEE ARTHROPLASTY     NECK SURGERY     REPLACEMENT TOTAL KNEE Bilateral    TOTAL HIP ARTHROPLASTY Bilateral    WRIST SURGERY Right    Social History   Social History Narrative   Not on file   Immunization History  Administered Date(s) Administered   Influenza,inj,Quad PF,6+ Mos 07/13/2016   Influenza-Unspecified 07/13/2016   Pneumococcal Conjugate-13 05/29/2017     Objective: Vital Signs: There were no vitals taken for this visit.   Physical Exam Vitals and nursing note reviewed.  Constitutional:      Appearance: She is well-developed.  HENT:     Head: Normocephalic and atraumatic.  Eyes:     Conjunctiva/sclera: Conjunctivae normal.  Cardiovascular:     Rate and Rhythm: Normal rate and regular rhythm.     Heart sounds: Normal heart sounds.  Pulmonary:     Effort: Pulmonary effort is normal.     Breath sounds: Normal breath  sounds.  Abdominal:     General: Bowel sounds are normal.     Palpations: Abdomen is soft.  Musculoskeletal:     Cervical back: Normal range of motion.  Skin:    General: Skin is warm and dry.     Capillary Refill: Capillary refill takes less than 2 seconds.  Neurological:     Mental Status: She is alert and oriented to person, place, and time.  Psychiatric:        Behavior: Behavior normal.      Musculoskeletal Exam: ***  CDAI Exam: CDAI Score: -- Patient Global: --;  Provider Global: -- Swollen: --; Tender: -- Joint Exam 11/15/2022   No joint exam has been documented for this visit   There is currently no information documented on the homunculus. Go to the Rheumatology activity and complete the homunculus joint exam.  Investigation: No additional findings.  Imaging: No results found.  Recent Labs: Lab Results  Component Value Date   WBC 5.1 06/15/2022   HGB 12.6 06/15/2022   PLT 288 06/15/2022   NA 140 06/15/2022   K 5.3 06/15/2022   CL 106 06/15/2022   CO2 25 06/15/2022   GLUCOSE 84 06/15/2022   BUN 23 06/15/2022   CREATININE 1.07 (H) 06/15/2022   BILITOT 0.3 06/15/2022   ALKPHOS 98 02/16/2017   AST 20 06/15/2022   ALT 16 06/15/2022   PROT 6.7 06/15/2022   ALBUMIN 3.8 02/16/2017   CALCIUM 9.4 06/15/2022   GFRAA 64 09/29/2020    Speciality Comments: PLQ Eye Exam: 09/26/2022 WNL@ [facility] Follow up in 1 year Osteoporosis managed by another provider.  Procedures:  No procedures performed Allergies: Patient has no known allergies.   Assessment / Plan:     Visit Diagnoses: Rheumatoid arthritis involving multiple sites with positive rheumatoid factor (HCC)  High risk medications (not anticoagulants) long-term use  Chronic right shoulder pain  Closed fracture of right wrist, sequela  History of bilateral total hip arthroplasty  History of total bilateral knee replacement  DDD (degenerative disc disease), cervical  DDD (degenerative disc disease), lumbar  Fibromyalgia  Other fatigue  Primary insomnia  Age-related osteoporosis without current pathological fracture  History of depression  History of renal insufficiency  Orders: No orders of the defined types were placed in this encounter.  No orders of the defined types were placed in this encounter.   Face-to-face time spent with patient was *** minutes. Greater than 50% of time was spent in counseling and coordination of care.  Follow-Up  Instructions: No follow-ups on file.   Ofilia Neas, PA-C  Note - This record has been created using Dragon software.  Chart creation errors have been sought, but may not always  have been located. Such creation errors do not reflect on  the standard of medical care.

## 2022-11-15 ENCOUNTER — Ambulatory Visit: Payer: Medicare Other | Admitting: Physician Assistant

## 2022-11-15 DIAGNOSIS — Z79899 Other long term (current) drug therapy: Secondary | ICD-10-CM

## 2022-11-15 DIAGNOSIS — Z96653 Presence of artificial knee joint, bilateral: Secondary | ICD-10-CM

## 2022-11-15 DIAGNOSIS — M5030 Other cervical disc degeneration, unspecified cervical region: Secondary | ICD-10-CM

## 2022-11-15 DIAGNOSIS — S62101S Fracture of unspecified carpal bone, right wrist, sequela: Secondary | ICD-10-CM

## 2022-11-15 DIAGNOSIS — M797 Fibromyalgia: Secondary | ICD-10-CM

## 2022-11-15 DIAGNOSIS — M810 Age-related osteoporosis without current pathological fracture: Secondary | ICD-10-CM

## 2022-11-15 DIAGNOSIS — M5136 Other intervertebral disc degeneration, lumbar region: Secondary | ICD-10-CM

## 2022-11-15 DIAGNOSIS — Z87448 Personal history of other diseases of urinary system: Secondary | ICD-10-CM

## 2022-11-15 DIAGNOSIS — M0579 Rheumatoid arthritis with rheumatoid factor of multiple sites without organ or systems involvement: Secondary | ICD-10-CM

## 2022-11-15 DIAGNOSIS — R5383 Other fatigue: Secondary | ICD-10-CM

## 2022-11-15 DIAGNOSIS — G8929 Other chronic pain: Secondary | ICD-10-CM

## 2022-11-15 DIAGNOSIS — Z8659 Personal history of other mental and behavioral disorders: Secondary | ICD-10-CM

## 2022-11-15 DIAGNOSIS — F5101 Primary insomnia: Secondary | ICD-10-CM

## 2022-11-15 DIAGNOSIS — Z96643 Presence of artificial hip joint, bilateral: Secondary | ICD-10-CM

## 2022-11-16 NOTE — Progress Notes (Signed)
Office Visit Note  Patient: Tammie [name]             Date of Birth: 1951/01/06           MRN: [id number]             PCP: Henderson Baltimore, FNP Referring: Henderson Baltimore, FNP Visit Date: 11/22/2022 Occupation: '@GUAROCC'$ @  Subjective:  Pain in multiple joints  History of Present Illness: JAMACIA GOETZMAN is a 72 y.o. female history of seropositive rheumatoid arthritis, osteoarthritis, degenerative disc disease and fibromyalgia syndrome.  She states she continues to have pain and discomfort in multiple joints.  She has significant morning stiffness lasting for couple of hours.  She also has nocturnal pain.  She states she has muscle cramps in her joints.  She has not noticed any joint swelling.  He has discomfort in her hands, shoulders, hips and her knees.  She states that her ring finger has been locking.  She also has discomfort in her neck and lower back.  She states that she cannot tolerate tight clothing.    Activities of Daily Living:  Patient reports morning stiffness for 2 hours.   Patient Reports nocturnal pain.  Difficulty dressing/grooming: Denies Difficulty climbing stairs: Reports Difficulty getting out of chair: Denies Difficulty using hands for taps, buttons, cutlery, and/or writing: Reports  Review of Systems  Constitutional: Negative.  Negative for fatigue.  HENT: Negative.  Negative for mouth sores and mouth dryness.   Eyes: Negative.  Negative for dryness.  Respiratory: Negative.  Negative for shortness of breath.   Cardiovascular: Negative.  Negative for chest pain and palpitations.  Gastrointestinal: Negative.  Negative for blood in stool, constipation and diarrhea.  Endocrine: Negative.  Negative for increased urination.  Genitourinary: Negative.  Negative for involuntary urination.  Musculoskeletal:  Positive for joint swelling, morning stiffness and muscle tenderness. Negative for joint pain, gait problem, joint pain, myalgias, muscle  weakness and myalgias.  Skin: Negative.  Negative for color change, rash, hair loss and sensitivity to sunlight.  Allergic/Immunologic: Negative.  Negative for susceptible to infections.  Neurological: Negative.  Negative for dizziness and headaches.  Hematological: Negative.  Negative for swollen glands.  Psychiatric/Behavioral:  Positive for depressed mood and sleep disturbance. The patient is not nervous/anxious.     PMFS History:  Patient Active Problem List   Diagnosis Date Noted   DDD (degenerative disc disease), lumbar 02/08/2017   DDD (degenerative disc disease), cervical 02/08/2017   Primary osteoarthritis of both hips 02/08/2017   Bilateral primary osteoarthritis of knee 02/08/2017   History of renal insufficiency 02/08/2017   Plantar fasciitis 02/08/2017   History of bilateral total hip arthroplasty 02/08/2017   History of total knee replacement 02/08/2017   History of depression 02/08/2017   Trapezius muscle spasm 07/18/2016   Bilateral sacroiliitis ([place]) 07/18/2016   Rheumatoid arthritis involving multiple sites with positive rheumatoid factor ([place]) 07/17/2016   High risk medications (not anticoagulants) long-term use 07/17/2016   Fibromyalgia 07/17/2016   Fatigue 07/17/2016   Insomnia 07/17/2016    Past Medical History:  Diagnosis Date   CRI (chronic renal insufficiency)    DDD (degenerative disc disease), cervical    DDD (degenerative disc disease), lumbar    Depression    Fibromyalgia    OA (osteoarthritis) of knee    Osteoarthritis of hip    Plantar fasciitis, bilateral    Rheumatoid arthritis ([place])     History reviewed. No pertinent family history. Past Surgical History:  Procedure  Laterality Date   ANKLE FRACTURE SURGERY     APPENDECTOMY     CESAREAN SECTION     x2   CHOLECYSTECTOMY     ELBOW SURGERY     JOINT REPLACEMENT     BIL knee    JOINT REPLACEMENT     BIL hip   KNEE ARTHROPLASTY     NECK SURGERY     REPLACEMENT TOTAL KNEE Bilateral     TOTAL HIP ARTHROPLASTY Bilateral    WRIST SURGERY Right    Social History   Social History Narrative   Not on file   Immunization History  Administered Date(s) Administered   Influenza,inj,Quad PF,6+ Mos 07/13/2016   Influenza-Unspecified 07/13/2016   Pneumococcal Conjugate-13 05/29/2017     Objective: Vital Signs: BP (!) 145/81 (BP Location: Left Arm, Patient Position: Sitting, Cuff Size: Large)   Pulse 60   Ht '5\' 6"'$  (1.676 m)   Wt 176 lb 3.2 oz (79.9 kg)   BMI 28.44 kg/m    Physical Exam Vitals and nursing note reviewed.  Constitutional:      Appearance: She is well-developed.  HENT:     Head: Normocephalic and atraumatic.  Eyes:     Conjunctiva/sclera: Conjunctivae normal.  Cardiovascular:     Rate and Rhythm: Normal rate and regular rhythm.     Heart sounds: Normal heart sounds.  Pulmonary:     Effort: Pulmonary effort is normal.     Breath sounds: Normal breath sounds.  Abdominal:     General: Bowel sounds are normal.     Palpations: Abdomen is soft.  Musculoskeletal:     Cervical back: Normal range of motion.  Lymphadenopathy:     Cervical: No cervical adenopathy.  Skin:    General: Skin is warm and dry.     Capillary Refill: Capillary refill takes less than 2 seconds.  Neurological:     Mental Status: She is alert and oriented to person, place, and time.  Psychiatric:        Behavior: Behavior normal.      Musculoskeletal Exam: She had limited range of motion of the cervical spine.  She had painful limited range of motion of the lumbar spine.  Shoulder joints were in good range of motion with discomfort on internal rotation of her right shoulder joint.  Elbow joints and wrist joints in good range of motion.  There was no synovitis of her wrist joints or MCPs.  She had bilateral PIP and DIP thickening.  She had right ring trigger finger.  She had limited range of motion of bilateral hip joints.  She had bilateral hip joint replacements in the past.  She had  full range of motion of bilateral knee joints.  She had bilateral knee joint replacement.  She had no tenderness over ankles or MTPs.  CDAI Exam: CDAI Score: -- Patient Global: 5 mm; Provider Global: 2 mm Swollen: --; Tender: -- Joint Exam 11/22/2022   No joint exam has been documented for this visit   There is currently no information documented on the homunculus. Go to the Rheumatology activity and complete the homunculus joint exam.  Investigation: No additional findings.  Imaging: No results found.  Recent Labs: Lab Results  Component Value Date   WBC 5.1 06/15/2022   HGB 12.6 06/15/2022   PLT 288 06/15/2022   NA 140 06/15/2022   K 5.3 06/15/2022   CL 106 06/15/2022   CO2 25 06/15/2022   GLUCOSE 84 06/15/2022   BUN 23 06/15/2022  CREATININE 1.07 (H) 06/15/2022   BILITOT 0.3 06/15/2022   ALKPHOS 98 02/16/2017   AST 20 06/15/2022   ALT 16 06/15/2022   PROT 6.7 06/15/2022   ALBUMIN 3.8 02/16/2017   CALCIUM 9.4 06/15/2022   GFRAA 64 09/29/2020    Speciality Comments: PLQ Eye Exam: 09/26/2022 WNL@ [facility] Follow up in 1 year Osteoporosis managed by another provider.  Procedures:  Large Joint Inj: R glenohumeral on 11/22/2022 1:35 PM Indications: pain Details: 27 G 1.5 in needle, posterior approach  Arthrogram: No  Medications: 1.5 mL lidocaine 1 %; 40 mg triamcinolone acetonide 40 MG/ML Aspirate: 0 mL Outcome: tolerated well, no immediate complications Procedure, treatment alternatives, risks and benefits explained, specific risks discussed. Consent was given by the patient. Immediately prior to procedure a time out was called to verify the correct patient, procedure, equipment, support staff and site/side marked as required. Patient was prepped and draped in the usual sterile fashion.     Allergies: Patient has no known allergies.   Assessment / Plan:     Visit Diagnoses: Rheumatoid arthritis involving multiple sites with positive rheumatoid  factor (HCC)-she complains of pain and stiffness in multiple joints.  She gives history history of significant morning stiffness and joint swelling.  No synovitis was noted on the examination.  She continues to be on hydroxychloroquine 200 mg p.o. twice daily Monday to Friday.  High risk medications (not anticoagulants) long-term use - - Plaquenil 200 mg 1 tablet by mouth twice daily Monday through Friday only.  PLQ Eye Exam:10/25/2022- Plan: CBC with Differential/Platelet, COMPLETE METABOLIC PANEL WITH GFR today.  She was advised to get labs every 5 months.  Information regarding immunization was placed in the AVS.  Closed fracture of right wrist, sequela-doing well.  Chronic right shoulder pain - s/p injection on 11/02/2021 she has been having increased pain and discomfort in her right shoulder joint.  She had limited internal rotation.  X-rays in the past showed acromioclavicular arthritis.  Per patient's request after informed consent was obtained right glenohumeral joint was injected with lidocaine and cortisone as described above.  Patient tolerated the procedure well.  Postprocedure instructions were given.  Trigger finger, right ring finger-she had thickening of the right fourth flexor tendon.  I discussed the benefit of cortisone injection.  Patient will try Voltaren gel topically for now.  She will contact us if her symptoms do not improve.  Primary osteoarthritis of both hands-she complains of pain in stiffness in her bilateral hands.  Bilateral PIP and DIP thickening was noted.  No synovitis was noted.  History of bilateral total hip arthroplasty-she has chronic discomfort in her hip joints.  She had limited range of motion of her hip joints.  History of total bilateral knee replacement-she continues to have some comfort in her knee joints.  No warmth swelling or effusion was noted.  DDD (degenerative disc disease), cervical-she had limited range of motion of the cervical spine without  discomfort.  DDD (degenerative disc disease), lumbar-she has chronic pain.  She had limited range of motion of lumbar spine.  She has been going to [facility].  Fibromyalgia -he is on Percocet every 4 hours as needed for pain relief and takes gabapentin 300 mg p.o. nightly  Other fatigue-related to insomnia and fibromyalgia.  Primary insomnia-good sleep hygiene was discussed.  Age-related osteoporosis without current pathological fracture-patient states that she stopped Fosamax.  I do not have DEXA results.  DEXA scan is done by her PCP.  Use of calcium rich diet, vitamin D and exercise were emphasized.  Essential hypertension-patient states that she stopped lisinopril.  Her blood pressure was elevated at 164/88.  Repeat blood pressure was 145/81.She was advised to monitor blood pressure closely and restart her blood pressure medication.  I advised her to schedule an appointment with the PCP to discuss treatment of hypertension.  History of depression-she is currently not taking any medications.  History of renal insufficiency-her creatinine has been mildly elevated.  Orders: Orders Placed This Encounter  Procedures   Large Joint Inj   CBC with Differential/Platelet   COMPLETE METABOLIC PANEL WITH GFR   No orders of the defined types were placed in this encounter.    Follow-Up Instructions: Return in about 5 months (around 04/22/2023) for Rheumatoid arthritis, Osteoarthritis.   Bo Merino, MD  Note - This record has been created using Editor, commissioning.  Chart creation errors have been sought, but may not always  have been located. Such creation errors do not reflect on  the standard of medical care.

## 2022-11-22 ENCOUNTER — Encounter: Payer: Self-pay | Admitting: Rheumatology

## 2022-11-22 ENCOUNTER — Ambulatory Visit: Payer: Medicare Other | Attending: Physician Assistant | Admitting: Rheumatology

## 2022-11-22 VITALS — BP 145/81 | HR 60 | Ht 66.0 in | Wt 176.2 lb

## 2022-11-22 DIAGNOSIS — M0579 Rheumatoid arthritis with rheumatoid factor of multiple sites without organ or systems involvement: Secondary | ICD-10-CM

## 2022-11-22 DIAGNOSIS — S62101S Fracture of unspecified carpal bone, right wrist, sequela: Secondary | ICD-10-CM

## 2022-11-22 DIAGNOSIS — Z79899 Other long term (current) drug therapy: Secondary | ICD-10-CM | POA: Diagnosis not present

## 2022-11-22 DIAGNOSIS — Z96643 Presence of artificial hip joint, bilateral: Secondary | ICD-10-CM

## 2022-11-22 DIAGNOSIS — R5383 Other fatigue: Secondary | ICD-10-CM

## 2022-11-22 DIAGNOSIS — M25511 Pain in right shoulder: Secondary | ICD-10-CM

## 2022-11-22 DIAGNOSIS — M19042 Primary osteoarthritis, left hand: Secondary | ICD-10-CM

## 2022-11-22 DIAGNOSIS — Z96653 Presence of artificial knee joint, bilateral: Secondary | ICD-10-CM

## 2022-11-22 DIAGNOSIS — M5136 Other intervertebral disc degeneration, lumbar region: Secondary | ICD-10-CM

## 2022-11-22 DIAGNOSIS — M810 Age-related osteoporosis without current pathological fracture: Secondary | ICD-10-CM

## 2022-11-22 DIAGNOSIS — M19041 Primary osteoarthritis, right hand: Secondary | ICD-10-CM

## 2022-11-22 DIAGNOSIS — Z87448 Personal history of other diseases of urinary system: Secondary | ICD-10-CM

## 2022-11-22 DIAGNOSIS — Z8659 Personal history of other mental and behavioral disorders: Secondary | ICD-10-CM

## 2022-11-22 DIAGNOSIS — M5030 Other cervical disc degeneration, unspecified cervical region: Secondary | ICD-10-CM

## 2022-11-22 DIAGNOSIS — G8929 Other chronic pain: Secondary | ICD-10-CM

## 2022-11-22 DIAGNOSIS — M797 Fibromyalgia: Secondary | ICD-10-CM

## 2022-11-22 DIAGNOSIS — I10 Essential (primary) hypertension: Secondary | ICD-10-CM

## 2022-11-22 DIAGNOSIS — F5101 Primary insomnia: Secondary | ICD-10-CM

## 2022-11-22 DIAGNOSIS — M51369 Other intervertebral disc degeneration, lumbar region without mention of lumbar back pain or lower extremity pain: Secondary | ICD-10-CM

## 2022-11-22 DIAGNOSIS — M65341 Trigger finger, right ring finger: Secondary | ICD-10-CM

## 2022-11-22 MED ORDER — LIDOCAINE HCL 1 % IJ SOLN
1.5000 mL | INTRAMUSCULAR | Status: AC | PRN
  Administered 2022-11-22: 1.5 mL

## 2022-11-22 MED ORDER — TRIAMCINOLONE ACETONIDE 40 MG/ML IJ SUSP
40.0000 mg | INTRAMUSCULAR | Status: AC | PRN
  Administered 2022-11-22: 40 mg via INTRA_ARTICULAR

## 2022-11-22 NOTE — Patient Instructions (Signed)
Vaccines You are taking a medication(s) that can suppress your immune system.  The following immunizations are recommended: Flu annually Covid-19  Td/Tdap (tetanus, diphtheria, pertussis) every 10 years Pneumonia (Prevnar 15 then Pneumovax 23 at least 1 year apart.  Alternatively, can take Prevnar 20 without needing additional dose) Shingrix: 2 doses from 4 weeks to 6 months apart  Please check with your PCP to make sure you are up to date.

## 2022-11-23 LAB — CBC WITH DIFFERENTIAL/PLATELET
Absolute Monocytes: 463 cells/uL (ref 200–950)
Basophils Absolute: 31 cells/uL (ref 0–200)
Basophils Relative: 0.6 %
Eosinophils Absolute: 120 cells/uL (ref 15–500)
Eosinophils Relative: 2.3 %
HCT: 37.3 % (ref 35.0–45.0)
Hemoglobin: 12.2 g/dL (ref 11.7–15.5)
Lymphs Abs: 1539 cells/uL (ref 850–3900)
MCH: 30 pg (ref 27.0–33.0)
MCHC: 32.7 g/dL (ref 32.0–36.0)
MCV: 91.9 fL (ref 80.0–100.0)
MPV: 9.9 fL (ref 7.5–12.5)
Monocytes Relative: 8.9 %
Neutro Abs: 3047 cells/uL (ref 1500–7800)
Neutrophils Relative %: 58.6 %
Platelets: 287 10*3/uL (ref 140–400)
RBC: 4.06 10*6/uL (ref 3.80–5.10)
RDW: 13 % (ref 11.0–15.0)
Total Lymphocyte: 29.6 %
WBC: 5.2 10*3/uL (ref 3.8–10.8)

## 2022-11-23 LAB — COMPLETE METABOLIC PANEL WITH GFR
AG Ratio: 2.1 (calc) (ref 1.0–2.5)
ALT: 19 U/L (ref 6–29)
AST: 21 U/L (ref 10–35)
Albumin: 4.4 g/dL (ref 3.6–5.1)
Alkaline phosphatase (APISO): 82 U/L (ref 37–153)
BUN: 23 mg/dL (ref 7–25)
CO2: 26 mmol/L (ref 20–32)
Calcium: 9.2 mg/dL (ref 8.6–10.4)
Chloride: 107 mmol/L (ref 98–110)
Creat: 1 mg/dL (ref 0.60–1.00)
Globulin: 2.1 g/dL (calc) (ref 1.9–3.7)
Glucose, Bld: 92 mg/dL (ref 65–99)
Potassium: 4.3 mmol/L (ref 3.5–5.3)
Sodium: 139 mmol/L (ref 135–146)
Total Bilirubin: 0.3 mg/dL (ref 0.2–1.2)
Total Protein: 6.5 g/dL (ref 6.1–8.1)
eGFR: 61 mL/min/{1.73_m2} (ref >=60)

## 2022-11-23 NOTE — Progress Notes (Signed)
CBC and CMP are normal.

## 2022-12-14 LAB — HM DEXA SCAN

## 2022-12-19 ENCOUNTER — Other Ambulatory Visit: Payer: Self-pay | Admitting: Physician Assistant

## 2023-01-02 ENCOUNTER — Telehealth: Payer: Self-pay | Admitting: *Deleted

## 2023-01-02 NOTE — Telephone Encounter (Signed)
Received DEXA results from [facility].  Date of Scan: 12/14/2022  Lowest T-score:-2.8  BMD:0.524  Lowest site measured:Left 1/3 distal radius  DX: Osteoporosis  Significant changes in BMD and site measured (5% and above):-4% Left 1/3 distal radius  Current Regimen:Fosamax from 2020 to 2023. Managed by another provider  Recommendation:Discuss at follow up visit  Reviewed by: Dr. Pollyann Savoy   Next Appointment:  04/26/2023

## 2023-02-09 ENCOUNTER — Other Ambulatory Visit: Payer: Self-pay | Admitting: Rheumatology

## 2023-02-09 MED ORDER — HYDROXYCHLOROQUINE SULFATE 200 MG PO TABS: ORAL_TABLET | ORAL | 0 refills | Status: DC

## 2023-02-09 NOTE — Telephone Encounter (Signed)
Patient called requesting prescription refill of Hydroxychloroquine to be sent to [facility].

## 2023-02-09 NOTE — Telephone Encounter (Signed)
Last Fill: 10/30/2022  Eye exam: 09/26/2022 WNL   Labs: 11/22/2022 CBC and CMP are normal.   Next Visit: 04/26/2023  Last Visit: 11/22/2022  DX: Rheumatoid arthritis involving multiple sites with positive rheumatoid factor   Current Dose per office note 11/22/2022: Plaquenil 200 mg 1 tablet by mouth twice daily Monday through Friday only   Okay to refill Plaquenil?

## 2023-03-30 ENCOUNTER — Other Ambulatory Visit: Payer: Self-pay | Admitting: Physician Assistant

## 2023-04-12 NOTE — Progress Notes (Signed)
Office Visit Note  Patient: Tammie [name]             Date of Birth: Jun 27, 1951           MRN: [id number]             PCP: Amelia Jo, FNP Referring: Amelia Jo, FNP Visit Date: 04/26/2023 Occupation: @GUAROCC @  Subjective:  Medication management  History of Present Illness: Tammie [name] is a 72 y.o. female with seropositive rheumatoid arthritis, osteoarthritis, degenerative disc disease and fibromyalgia.  She states she continues to have pain and discomfort in all of her joints.  She states she has been taking Percocet and Lyrica on a regular basis which gives her some relief but she still states in constant discomfort.  She continues to have pain and discomfort in her lower back.  She has difficulty walking.  She gives history of stiffness in her hands but no joint swelling.  She has been taking hydroxychloroquine on a regular basis.  She continues to have right ring trigger finger but she does not want injection.  She has generalized pain and discomfort from fibromyalgia.  She has been off Fosamax since 2023.  Has been taking calcium and vitamin D.    Activities of Daily Living:  Patient reports morning stiffness for 2-3 hours.   Patient Reports nocturnal pain.  Difficulty dressing/grooming: Reports Difficulty climbing stairs: Reports Difficulty getting out of chair: Reports Difficulty using hands for taps, buttons, cutlery, and/or writing: Reports  Review of Systems  Constitutional:  Positive for fatigue.  HENT:  Negative for mouth sores and mouth dryness.   Eyes:  Negative for dryness.  Respiratory:  Negative for shortness of breath.   Cardiovascular:  Negative for chest pain and palpitations.  Gastrointestinal:  Negative for blood in stool, constipation and diarrhea.  Endocrine: Negative for increased urination.  Genitourinary:  Negative for involuntary urination.  Musculoskeletal:  Positive for joint pain, gait problem, joint pain, myalgias,  muscle weakness, morning stiffness, muscle tenderness and myalgias. Negative for joint swelling.  Skin:  Positive for hair loss. Negative for color change, rash and sensitivity to sunlight.  Allergic/Immunologic: Negative for susceptible to infections.  Neurological:  Negative for dizziness and headaches.  Hematological:  Negative for swollen glands.  Psychiatric/Behavioral:  Positive for sleep disturbance. Negative for depressed mood. The patient is not nervous/anxious.     PMFS History:  Patient Active Problem List   Diagnosis Date Noted   DDD (degenerative disc disease), lumbar 02/08/2017   DDD (degenerative disc disease), cervical 02/08/2017   Primary osteoarthritis of both hips 02/08/2017   Bilateral primary osteoarthritis of knee 02/08/2017   History of renal insufficiency 02/08/2017   Plantar fasciitis 02/08/2017   History of bilateral total hip arthroplasty 02/08/2017   History of total knee replacement 02/08/2017   History of depression 02/08/2017   Trapezius muscle spasm 07/18/2016   Bilateral sacroiliitis (HCC) 07/18/2016   Rheumatoid arthritis involving multiple sites with positive rheumatoid factor (HCC) 07/17/2016   High risk medications (not anticoagulants) long-term use 07/17/2016   Fibromyalgia 07/17/2016   Fatigue 07/17/2016   Insomnia 07/17/2016    Past Medical History:  Diagnosis Date   CRI (chronic renal insufficiency)    DDD (degenerative disc disease), cervical    DDD (degenerative disc disease), lumbar    Depression    Fibromyalgia    OA (osteoarthritis) of knee    Osteoarthritis of hip    Plantar fasciitis, bilateral    Rheumatoid arthritis (HCC)  History reviewed. No pertinent family history. Past Surgical History:  Procedure Laterality Date   ANKLE FRACTURE SURGERY     APPENDECTOMY     CESAREAN SECTION     x2   CHOLECYSTECTOMY     ELBOW SURGERY     JOINT REPLACEMENT     BIL knee    JOINT REPLACEMENT     BIL hip   KNEE ARTHROPLASTY      NECK SURGERY     REPLACEMENT TOTAL KNEE Bilateral    TOTAL HIP ARTHROPLASTY Bilateral    WRIST SURGERY Right    Social History   Social History Narrative   Not on file   Immunization History  Administered Date(s) Administered   Influenza,inj,Quad PF,6+ Mos 07/13/2016   Influenza-Unspecified 07/13/2016   Pneumococcal Conjugate-13 05/29/2017     Objective: Vital Signs: BP (!) 152/82 (BP Location: Left Arm, Patient Position: Sitting, Cuff Size: Normal)   Pulse 81   Resp 16   Ht 5\' 6"  (1.676 m)   Wt 171 lb 12.8 oz (77.9 kg)   BMI 27.73 kg/m    Physical Exam Vitals and nursing note reviewed.  Constitutional:      Appearance: She is well-developed.  HENT:     Head: Normocephalic and atraumatic.  Eyes:     Conjunctiva/sclera: Conjunctivae normal.  Cardiovascular:     Rate and Rhythm: Normal rate and regular rhythm.     Heart sounds: Normal heart sounds.  Pulmonary:     Effort: Pulmonary effort is normal.     Breath sounds: Normal breath sounds.  Abdominal:     General: Bowel sounds are normal.     Palpations: Abdomen is soft.  Musculoskeletal:     Cervical back: Normal range of motion.  Lymphadenopathy:     Cervical: No cervical adenopathy.  Skin:    General: Skin is warm and dry.     Capillary Refill: Capillary refill takes less than 2 seconds.  Neurological:     Mental Status: She is alert and oriented to person, place, and time.  Psychiatric:        Behavior: Behavior normal.      Musculoskeletal Exam: She had limited lateral rotation of the cervical spine.  She had limited painful range of motion of the lumbar spine.  Shoulders, elbow joints, wrist joints were in good range of motion.  She had discomfort range of motion of her right shoulder joint.  No synovitis was noted over MCPs PIPs or DIPs.  She had right ring trigger finger.  She had limited range of motion of bilateral hip joints.  She had good range of motion of bilateral knee joints.  Both knee joints  are replaced.  There was no tenderness over ankles or MTPs.  CDAI Exam: CDAI Score: 10  Patient Global: 30 / 100; Provider Global: 30 / 100 Swollen: 0 ; Tender: 7  Joint Exam 04/26/2023      Right  Left  Glenohumeral   Tender   Tender  Lumbar Spine   Tender     Hip   Tender   Tender  Knee   Tender   Tender     Investigation: No additional findings.  Imaging: No results found.  Recent Labs: Lab Results  Component Value Date   WBC 5.2 11/22/2022   HGB 12.2 11/22/2022   PLT 287 11/22/2022   NA 139 11/22/2022   K 4.3 11/22/2022   CL 107 11/22/2022   CO2 26 11/22/2022   GLUCOSE 92 11/22/2022  BUN 23 11/22/2022   CREATININE 1.00 11/22/2022   BILITOT 0.3 11/22/2022   ALKPHOS 98 02/16/2017   AST 21 11/22/2022   ALT 19 11/22/2022   PROT 6.5 11/22/2022   ALBUMIN 3.8 02/16/2017   CALCIUM 9.2 11/22/2022   GFRAA 64 09/29/2020    Speciality Comments: PLQ Eye Exam: 09/26/2022 WNL@ [facility] Follow up in 1 year Fosamax 2020-2023 by another provider   December 14, 2022 DEXA scan T-score -2.8, BMD 0.524 left one third radius -4% change from February 23, 2017 Procedures:  No procedures performed Allergies: Patient has no known allergies.   Assessment / Plan:     Visit Diagnoses: Rheumatoid arthritis involving multiple sites with positive rheumatoid factor (HCC)-patient had no synovitis on the examination.  She has been taking hydroxychloroquine 200 mg 1 p.o. twice daily Monday to Friday.  She has been getting regular eye examinations annually.  She continues to have generalized pain and discomfort.  High risk medications (not anticoagulants) long-term use - Plaquenil 200 mg 1 tablet by mouth twice daily Monday through Friday only. PLQ Eye Exam: 09/26/2022 -Labs obtained on November 22, 2022 CBC and CMP were normal.  She will get labs today.  Plan: CBC with Differential/Platelet, COMPLETE METABOLIC PANEL WITH GFR information on immunization was placed in the AVS.  Closed fracture  of right wrist, sequela  Chronic right shoulder pain -she can history of discomfort in the bilateral shoulders.  X-rays in the past showed acromioclavicular arthritis.  Trigger finger, right ring finger-patient declined cortisone injection.  Primary osteoarthritis of both hands-she has bilateral PIP and DIP thickening with no synovitis.  History of bilateral total hip arthroplasty-she history of discomfort in her hip joints.  She has limited range of motion of bilateral hip joints.  History of total bilateral knee replacement-her both knee joints are replaced without any warmth swelling or effusion.  She has discomfort in her knee joints.  She has difficulty walking due to hip and knee joint discomfort.  DDD (degenerative disc disease), cervical-she had limited range of motion with the stiffness.  DDD (degenerative disc disease), lumbar-she had limited range of motion with discomfort.  She  Fibromyalgia -she continues to have generalized pain and discomfort from fibromyalgia.  She had hyperalgesia and positive tender points.  She is on Percocet every 4 hours as needed for pain relief and takes pregabalin 100 mg p.o. nightly  Other fatigue-related to fibromyalgia and insomnia.  Primary insomnia-good sleep hygiene was discussed.  Age-related osteoporosis without current pathological fracture -patient was treated with Fosamax from 2020 until 2023 by her PCP.December 14, 2022 DEXA scan T-score -2.8, BMD 0.524 left one third radius -4% change from February 23, 2017 -I did detailed discussion with the patient regarding the DEXA scan.  Different treatment options and their side effects were discussed.  Patient did not have an adequate response to oral bisphosphonates.  I discussed the option of IV Reclast, subcutaneous Prolia, anabolic agents.  After reviewing indications side effects contraindications patient wants to proceed with Evenity.  Indications side effects contraindications including the risk of  joint pain, muscle pain, hypocalcemia, osteonecrosis of the jaw, cardiovascular events including heart attack, thrombosis, stroke were discussed.  She has no pre-existing conditions.  I will obtain following labs today.  Once Evenity is approved we can schedule appointment to start her on the injections.  Plan: Parathyroid hormone, intact (no Ca), VITAMIN D 25 Hydroxy (Vit-D Deficiency, Fractures), Phosphorus, Serum protein electrophoresis with reflex  History of depression  History of renal insufficiency-creatinine was better in February.  Essential hypertension-blood pressure was elevated today at 152/82.  Patient states she is not taking her blood pressure medication as her blood pressure usually stays low.  I advised her to monitor blood pressure closely and follow-up with her PCP.  Orders: Orders Placed This Encounter  Procedures   CBC with Differential/Platelet   COMPLETE METABOLIC PANEL WITH GFR   Parathyroid hormone, intact (no Ca)   VITAMIN D 25 Hydroxy (Vit-D Deficiency, Fractures)   Phosphorus   Serum protein electrophoresis with reflex   No orders of the defined types were placed in this encounter.   Follow-Up Instructions: Return in about 5 months (around 09/26/2023) for Rheumatoid arthritis, Osteoarthritis, Osteoporosis.   Pollyann Savoy, MD  Note - This record has been created using Animal nutritionist.  Chart creation errors have been sought, but may not always  have been located. Such creation errors do not reflect on  the standard of medical care.

## 2023-04-26 ENCOUNTER — Ambulatory Visit: Payer: Medicare Other | Attending: Rheumatology | Admitting: Rheumatology

## 2023-04-26 ENCOUNTER — Encounter: Payer: Self-pay | Admitting: Rheumatology

## 2023-04-26 ENCOUNTER — Telehealth: Payer: Self-pay | Admitting: Pharmacist

## 2023-04-26 VITALS — BP 152/82 | HR 81 | Resp 16 | Ht 66.0 in | Wt 171.8 lb

## 2023-04-26 DIAGNOSIS — F5101 Primary insomnia: Secondary | ICD-10-CM

## 2023-04-26 DIAGNOSIS — M51369 Other intervertebral disc degeneration, lumbar region without mention of lumbar back pain or lower extremity pain: Secondary | ICD-10-CM

## 2023-04-26 DIAGNOSIS — I10 Essential (primary) hypertension: Secondary | ICD-10-CM

## 2023-04-26 DIAGNOSIS — R5383 Other fatigue: Secondary | ICD-10-CM

## 2023-04-26 DIAGNOSIS — Z96643 Presence of artificial hip joint, bilateral: Secondary | ICD-10-CM

## 2023-04-26 DIAGNOSIS — M810 Age-related osteoporosis without current pathological fracture: Secondary | ICD-10-CM

## 2023-04-26 DIAGNOSIS — M5136 Other intervertebral disc degeneration, lumbar region: Secondary | ICD-10-CM

## 2023-04-26 DIAGNOSIS — S62101S Fracture of unspecified carpal bone, right wrist, sequela: Secondary | ICD-10-CM

## 2023-04-26 DIAGNOSIS — M797 Fibromyalgia: Secondary | ICD-10-CM

## 2023-04-26 DIAGNOSIS — M0579 Rheumatoid arthritis with rheumatoid factor of multiple sites without organ or systems involvement: Secondary | ICD-10-CM | POA: Diagnosis not present

## 2023-04-26 DIAGNOSIS — M5030 Other cervical disc degeneration, unspecified cervical region: Secondary | ICD-10-CM

## 2023-04-26 DIAGNOSIS — G8929 Other chronic pain: Secondary | ICD-10-CM

## 2023-04-26 DIAGNOSIS — M19041 Primary osteoarthritis, right hand: Secondary | ICD-10-CM

## 2023-04-26 DIAGNOSIS — Z96653 Presence of artificial knee joint, bilateral: Secondary | ICD-10-CM

## 2023-04-26 DIAGNOSIS — Z87448 Personal history of other diseases of urinary system: Secondary | ICD-10-CM

## 2023-04-26 DIAGNOSIS — M25511 Pain in right shoulder: Secondary | ICD-10-CM

## 2023-04-26 DIAGNOSIS — Z8659 Personal history of other mental and behavioral disorders: Secondary | ICD-10-CM

## 2023-04-26 DIAGNOSIS — M65341 Trigger finger, right ring finger: Secondary | ICD-10-CM

## 2023-04-26 DIAGNOSIS — Z79899 Other long term (current) drug therapy: Secondary | ICD-10-CM

## 2023-04-26 DIAGNOSIS — M19042 Primary osteoarthritis, left hand: Secondary | ICD-10-CM

## 2023-04-26 NOTE — Patient Instructions (Signed)
Vaccines You are taking a medication(s) that can suppress your immune system.  The following immunizations are recommended: Flu annually Covid-19  RSV Td/Tdap (tetanus, diphtheria, pertussis) every 10 years Pneumonia (Prevnar 15 then Pneumovax 23 at least 1 year apart.  Alternatively, can take Prevnar 20 without needing additional dose) Shingrix: 2 doses from 4 weeks to 6 months apart  Please check with your PCP to make sure you are up to date.   Romosozumab Injection What is this medication? ROMOSOZUMAB (roe moe SOZ ue mab) prevents and treats osteoporosis. It works by Interior and spatial designer stronger and less likely to break (fracture). It is a monoclonal antibody. This medicine may be used for other purposes; ask your health care provider or pharmacist if you have questions. COMMON BRAND NAME(S): EVENITY What should I tell my care team before I take this medication? They need to know if you have any of these conditions: Dental disease Heart attack Heart disease Kidney problems Low levels of calcium in the blood On dialysis Stroke Wear dentures An unusual or allergic reaction to romosozumab, other medications, foods, dyes or preservatives Pregnant or trying to get pregnant Breast-feeding How should I use this medication? This medication is injected under the skin. It is given by your care team in a hospital or clinic setting. A special MedGuide will be given to you by the pharmacist with each prescription and refill. Be sure to read this information carefully each time. Talk to your care team about the use of this medication in children. Special care may be needed. Overdosage: If you think you have taken too much of this medicine contact a poison control center or emergency room at once. NOTE: This medicine is only for you. Do not share this medicine with others. What if I miss a dose? Keep appointments for follow-up doses. It is important not to miss your dose. Call your care team if  you are unable to keep an appointment. What may interact with this medication? Interactions are not expected. This list may not describe all possible interactions. Give your health care provider a list of all the medicines, herbs, non-prescription drugs, or dietary supplements you use. Also tell them if you smoke, drink alcohol, or use illegal drugs. Some items may interact with your medicine. What should I watch for while using this medication? Your condition will be monitored carefully while you are receiving this medication. You may need bloodwork while taking this medication. You should make sure you get enough calcium and vitamin D while you are taking this medication. Discuss the foods you eat and the vitamins you take with your care team. Some people who take this medication have severe bone, joint, or muscle pain. This medication may also increase your risk for jaw problems or a broken thigh bone. Tell your care team right away if you have severe pain in your jaw, bones, joints, or muscles. Tell you care team if you have any pain that does not go away or that gets worse. Tell your dentist and dental surgeon that you are taking this medication. You should not have major dental surgery while on this medication. See your dentist to have a dental exam and fix any dental problems before starting this medication. Take good care of your teeth while on this medication. Make sure you see your dentist for regular follow-up appointments. What side effects may I notice from receiving this medication? Side effects that you should report to your care team as soon as possible: Allergic reactions  or angioedema--skin rash, itching or hives, swelling of the face, eyes, lips, tongue, arms, or legs, trouble swallowing or breathing Heart attack--pain or tightness in the chest, shoulders, arms, or jaw, nausea, shortness of breath, cold or clammy skin, feeling faint or lightheaded Low calcium level--muscle pain or  cramps, confusion, tingling, or numbness in the hands or feet Osteonecrosis of the jaw--pain, swelling, or redness in the mouth, numbness of the jaw, poor healing after dental work, unusual discharge from the mouth, visible bones in the mouth Severe bone, joint, or muscle pain Stroke--sudden numbness or weakness of the face, arm, or leg, trouble speaking, confusion, trouble walking, loss of balance or coordination, dizziness, severe headache, change in vision Side effects that usually do not require medical attention (report to your care team if they continue or are bothersome): Headache Joint pain Muscle spasms Pain, redness, or irritation at injection site Swelling of the ankles, hands, or feet This list may not describe all possible side effects. Call your doctor for medical advice about side effects. You may report side effects to FDA at 1-800-FDA-1088. Where should I keep my medication? This medication is given in a hospital or clinic. It will not be stored at home. NOTE: This sheet is a summary. It may not cover all possible information. If you have questions about this medicine, talk to your doctor, pharmacist, or health care provider.  2024 Elsevier/Gold Standard (2021-10-12 00:00:00)

## 2023-04-26 NOTE — Telephone Encounter (Addendum)
Pending baseline labs from today, Evenity therapy plan referral will be placed to [facility]  Chesley Mires, PharmD, MPH, BCPS, CPP Clinical Pharmacist (Rheumatology and Pulmonology)  ----- Message from [facility] Marissa G sent at 04/26/2023  2:32 PM EDT ----- Please apply for evenity, per Dr. Corliss Skains. Thanks!   Consent was obtained on [email or web address] hand out and sent to the scan center.

## 2023-04-27 ENCOUNTER — Telehealth: Payer: Self-pay

## 2023-04-27 DIAGNOSIS — R7989 Other specified abnormal findings of blood chemistry: Secondary | ICD-10-CM

## 2023-04-27 NOTE — Telephone Encounter (Signed)
-----   Message from [facility] sent at 04/27/2023 12:43 PM EDT ----- PTH is high.  Please notify the patient and refer her to endocrinology for an evaluation.

## 2023-04-27 NOTE — Progress Notes (Signed)
PTH is high.  Please notify the patient and refer her to endocrinology for an evaluation.

## 2023-05-06 NOTE — Progress Notes (Signed)
CBC is normal.  CMP shows elevated creatinine.  Patient should avoid all NSAIDs and increase water intake.  Vitamin D normal, IFE normal.  Please forward results to her PCP.

## 2023-05-30 NOTE — Telephone Encounter (Signed)
Patient had elevated PTH. Evenity initiation pending endocrinology referral and workup  Chesley Mires, PharmD, MPH, BCPS, CPP Clinical Pharmacist (Rheumatology and Pulmonology)

## 2023-06-21 LAB — LAB REPORT - SCANNED
Creatinine, POC: 39.9 mg/dL
EGFR: 68
Free T4: 1.16 ng/dL
TSH: 1.65 (ref 0.41–5.90)

## 2023-07-30 ENCOUNTER — Other Ambulatory Visit: Payer: Self-pay | Admitting: *Deleted

## 2023-07-30 MED ORDER — HYDROXYCHLOROQUINE SULFATE 200 MG PO TABS: ORAL_TABLET | ORAL | 0 refills | Status: DC

## 2023-07-30 NOTE — Telephone Encounter (Signed)
Patient contacted the office to request refill on PLQ. Patient requested to have prescription sent to [facility].   Last Fill: 02/09/2023  Eye exam: 09/26/2022 WNL   Labs: 04/26/2023 CBC is normal.  CMP shows elevated creatinine   Next Visit: 09/28/2023  Last Visit: 04/26/2023  DX: Rheumatoid arthritis involving multiple sites with positive rheumatoid factor   Current Dose per office note 04/26/2023: Plaquenil 200 mg 1 tablet by mouth twice daily Monday through Friday only   Okay to refill Plaquenil?

## 2023-09-14 NOTE — Progress Notes (Signed)
 Office Visit Note  Patient: Tammie [name]             Date of Birth: 30-Sep-1950           MRN: [id number]             PCP: Nicholaus Aleck LABOR, PA-C Referring: Rutherford Eleanor LABOR, FNP Visit Date: 09/28/2023 Occupation: @GUAROCC @  Subjective:  Pain in multiple joints  History of Present Illness: Tammie [name] is a 72 y.o. female with seropositive rheumatoid arthritis, osteoarthritis, degenerative disc disease and fibromyalgia.  Patient states she has been having increased pain in all of her joints.  Despite taking medication she is not having much relief.  She states she is seen a back specialist who recommended a spinal fusion.  She is going for cardiology clearance.  She continues to take hydroxychloroquine  200 mg p.o. twice daily Monday to Friday.  She states she will also require revision surgery on her right hip.  She is seen an orthopedic surgeon.  She continues to have generalized pain from fibromyalgia.    Activities of Daily Living:  Patient reports morning stiffness for 3 hours.   Patient Reports nocturnal pain.  Difficulty dressing/grooming: Reports Difficulty climbing stairs: Reports Difficulty getting out of chair: Reports Difficulty using hands for taps, buttons, cutlery, and/or writing: Reports  Review of Systems  Constitutional:  Negative for fatigue.  HENT:  Negative for mouth sores and mouth dryness.   Eyes:  Negative for dryness.  Respiratory:  Negative for shortness of breath.   Cardiovascular:  Negative for chest pain and palpitations.  Gastrointestinal:  Positive for constipation. Negative for blood in stool and diarrhea.  Endocrine: Negative for increased urination.  Genitourinary:  Negative for involuntary urination.  Musculoskeletal:  Positive for joint pain, gait problem, joint pain, muscle weakness and morning stiffness. Negative for joint swelling, myalgias, muscle tenderness and myalgias.  Skin:  Negative for color change, rash, hair loss  and sensitivity to sunlight.  Allergic/Immunologic: Negative for susceptible to infections.  Neurological:  Negative for dizziness and headaches.  Hematological:  Negative for swollen glands.  Psychiatric/Behavioral:  Positive for sleep disturbance. Negative for depressed mood. The patient is not nervous/anxious.     PMFS History:  Patient Active Problem List   Diagnosis Date Noted   DDD (degenerative disc disease), lumbar 02/08/2017   DDD (degenerative disc disease), cervical 02/08/2017   Primary osteoarthritis of both hips 02/08/2017   Bilateral primary osteoarthritis of knee 02/08/2017   History of renal insufficiency 02/08/2017   Plantar fasciitis 02/08/2017   History of bilateral total hip arthroplasty 02/08/2017   History of total knee replacement 02/08/2017   History of depression 02/08/2017   Trapezius muscle spasm 07/18/2016   Bilateral sacroiliitis (HCC) 07/18/2016   Rheumatoid arthritis involving multiple sites with positive rheumatoid factor (HCC) 07/17/2016   High risk medications (not anticoagulants) long-term use 07/17/2016   Fibromyalgia 07/17/2016   Fatigue 07/17/2016   Insomnia 07/17/2016    Past Medical History:  Diagnosis Date   CRI (chronic renal insufficiency)    DDD (degenerative disc disease), cervical    DDD (degenerative disc disease), lumbar    Depression    Fibromyalgia    OA (osteoarthritis) of knee    Osteoarthritis of hip    Plantar fasciitis, bilateral    Rheumatoid arthritis (HCC)     History reviewed. No pertinent family history. Past Surgical History:  Procedure Laterality Date   ANKLE FRACTURE SURGERY     APPENDECTOMY  CESAREAN SECTION     x2   CHOLECYSTECTOMY     ELBOW SURGERY     JOINT REPLACEMENT     BIL knee    JOINT REPLACEMENT     BIL hip   KNEE ARTHROPLASTY     NECK SURGERY     REPLACEMENT TOTAL KNEE Bilateral    TOTAL HIP ARTHROPLASTY Bilateral    WRIST SURGERY Right    Social History   Social History Narrative    Not on file   Immunization History  Administered Date(s) Administered   Influenza,inj,Quad PF,6+ Mos 07/13/2016   Influenza-Unspecified 07/13/2016   Pneumococcal Conjugate-13 05/29/2017     Objective: Vital Signs: BP (!) 154/84 (BP Location: Left Arm, Patient Position: Sitting, Cuff Size: Normal)   Pulse 66   Resp 14   Ht 5' 6 (1.676 m)   Wt 173 lb (78.5 kg)   BMI 27.92 kg/m    Physical Exam Vitals and nursing note reviewed.  Constitutional:      Appearance: She is well-developed.  HENT:     Head: Normocephalic and atraumatic.  Eyes:     Conjunctiva/sclera: Conjunctivae normal.  Cardiovascular:     Rate and Rhythm: Normal rate and regular rhythm.     Heart sounds: Normal heart sounds.  Pulmonary:     Effort: Pulmonary effort is normal.     Breath sounds: Normal breath sounds.  Abdominal:     General: Bowel sounds are normal.     Palpations: Abdomen is soft.  Musculoskeletal:     Cervical back: Normal range of motion.  Lymphadenopathy:     Cervical: No cervical adenopathy.  Skin:    General: Skin is warm and dry.     Capillary Refill: Capillary refill takes less than 2 seconds.  Neurological:     Mental Status: She is alert and oriented to person, place, and time.  Psychiatric:        Behavior: Behavior normal.      Musculoskeletal Exam: Patient had limited lateral rotation of the cervical spine.  She has limited painful range of motion of the lumbar spine.  Right shoulder joint abduction was limited to about 30 degrees.  Left shoulder joints in full range of motion.  Elbow joints and wrist joints in good range of motion.  No synovitis was noted over MCPs or PIPs.  She had limited painful range of motion of her hip joints.  Knee joints were in good range of motion.  Both knees were joints were replaced.  There was no tenderness over ankles or MTPs.  CDAI Exam: CDAI Score: 10  Patient Global: 50 / 100; Provider Global: 20 / 100 Swollen: 0 ; Tender: 6  Joint  Exam 09/28/2023      Right  Left  Glenohumeral   Tender     Lumbar Spine   Tender     Hip   Tender   Tender  Knee   Tender   Tender     Investigation: No additional findings.  Imaging: No results found.  Recent Labs: Lab Results  Component Value Date   WBC 4.9 04/26/2023   HGB 11.9 04/26/2023   PLT 272 04/26/2023   NA 141 04/26/2023   K 4.5 04/26/2023   CL 106 04/26/2023   CO2 34 (H) 04/26/2023   GLUCOSE 84 04/26/2023   BUN 23 04/26/2023   CREATININE 1.12 (H) 04/26/2023   BILITOT 0.4 04/26/2023   ALKPHOS 98 02/16/2017   AST 32 04/26/2023   ALT 22  04/26/2023   PROT 6.5 04/26/2023   PROT 6.7 04/26/2023   ALBUMIN 3.8 02/16/2017   CALCIUM 9.3 04/26/2023   GFRAA 64 09/29/2020    Speciality Comments: PLQ Eye Exam: 09/26/2022 WNL@ [facility] Follow up in 1 year Fosamax 2020-2023 by another provider  Procedures:  No procedures performed Allergies: Patient has no known allergies.   Assessment / Plan:     Visit Diagnoses: Rheumatoid arthritis involving multiple sites with positive rheumatoid factor (HCC)-patient complains of pain and discomfort in all of her joints.  No synovitis was noted on the examination.  She is on hydroxychloroquine  200 mg twice daily Monday to Friday.  High risk medications (not anticoagulants) long-term use - Plaquenil  200 mg 1 tablet by mouth twice daily Monday through Friday only. PLQ Eye Exam: 09/26/2022 -we will check labs today and every 5 months.  Her creatinine was elevated at 1.12 on May 15, 2023.  September 25, 2023 CBC WBC 5.08, hemoglobin 12.5, platelets 282, BMP creatinine 1.17 and GFR 50, calcium 8.8.  Patient was advised to get annual eye examination.  Will recheck labs at the follow-up visit.  Closed fracture of right wrist, sequela  Chronic right shoulder pain -patient has recurrence of right shoulder joint pain.  She has frozen right shoulder.  I would avoid cortisone injection patient is scheduled to have lumbar spine fusion  next week.  X-rays in the past showed acromioclavicular arthritis.  Trigger finger, right ring finger-she has intermittent triggering.  Primary osteoarthritis of both hands-she has bilateral PIP and DIP thickening.  No synovitis was noted.  History of bilateral total hip arthroplasty-patient has limited painful range of motion of bilateral hip joints.  Patient states she was advised revision of right hip joint.  History of total bilateral knee replacement-she continues to have discomfort in her knee joints.  No warmth swelling or effusion was noted.  DDD (degenerative disc disease), cervical-she has been range of motion cervical spine.  Spondylosis of lumbar spine-according the patient she was evaluated by the spine specialist and is scheduled to have lumbar fusion next week.  Fibromyalgia -she continues to have severe pain and discomfort despite being on medications.  She is on Percocet every 4 hours as needed for pain relief and takes pregabalin 100 mg p.o. nightly  Other fatigue-related to insomnia and fibromyalgia.  Primary insomnia-good sleep hygiene was discussed.  Age-related osteoporosis without current pathological fracture - Fosamax from 2020 until 2023 by her PCP.December 14, 2022 DEXA scan T-score -2.8, BMD 0.524 left one third radius -4% change from February 23, 2017 -we discussed Evenity  at the last visit.  Patient was evaluated by Dr. Faythe for elevated PTH.  According to the patient Dr. Faythe did further evaluation and advised her that PTH is not significant.  Will request records from Dr. Micheline office and will start her on Evenity  after the spine surgery.  History of depression  History of renal insufficiency-creatinine has been mildly elevated.  Essential hypertension-blood pressure was elevated at 154/84.  Patient states she is not taking blood pressure medication as she ran out of the medication.  I advised her to contact her PCP to get a refill on the medication.  Orders: No  orders of the defined types were placed in this encounter.  No orders of the defined types were placed in this encounter.    Follow-Up Instructions: Return in about 3 months (around 12/27/2023) for Rheumatoid arthritis, Osteoarthritis, Osteoporosis.   Maya Nash, MD  Note - This record  has been created using [employer].  Chart creation errors have been sought, but may not always  have been located. Such creation errors do not reflect on  the standard of medical care.

## 2023-09-25 ENCOUNTER — Other Ambulatory Visit: Payer: Self-pay | Admitting: Physician Assistant

## 2023-09-28 ENCOUNTER — Encounter: Payer: Self-pay | Admitting: Rheumatology

## 2023-09-28 ENCOUNTER — Ambulatory Visit: Payer: Medicare Other | Attending: Rheumatology | Admitting: Rheumatology

## 2023-09-28 VITALS — BP 154/84 | HR 66 | Resp 14 | Ht 66.0 in | Wt 173.0 lb

## 2023-09-28 DIAGNOSIS — S62101S Fracture of unspecified carpal bone, right wrist, sequela: Secondary | ICD-10-CM | POA: Diagnosis not present

## 2023-09-28 DIAGNOSIS — Z8659 Personal history of other mental and behavioral disorders: Secondary | ICD-10-CM

## 2023-09-28 DIAGNOSIS — Z87448 Personal history of other diseases of urinary system: Secondary | ICD-10-CM

## 2023-09-28 DIAGNOSIS — M47816 Spondylosis without myelopathy or radiculopathy, lumbar region: Secondary | ICD-10-CM

## 2023-09-28 DIAGNOSIS — F5101 Primary insomnia: Secondary | ICD-10-CM

## 2023-09-28 DIAGNOSIS — M0579 Rheumatoid arthritis with rheumatoid factor of multiple sites without organ or systems involvement: Secondary | ICD-10-CM

## 2023-09-28 DIAGNOSIS — Z96653 Presence of artificial knee joint, bilateral: Secondary | ICD-10-CM

## 2023-09-28 DIAGNOSIS — M19042 Primary osteoarthritis, left hand: Secondary | ICD-10-CM

## 2023-09-28 DIAGNOSIS — Z96643 Presence of artificial hip joint, bilateral: Secondary | ICD-10-CM

## 2023-09-28 DIAGNOSIS — Z79899 Other long term (current) drug therapy: Secondary | ICD-10-CM

## 2023-09-28 DIAGNOSIS — G8929 Other chronic pain: Secondary | ICD-10-CM

## 2023-09-28 DIAGNOSIS — M810 Age-related osteoporosis without current pathological fracture: Secondary | ICD-10-CM

## 2023-09-28 DIAGNOSIS — R5383 Other fatigue: Secondary | ICD-10-CM

## 2023-09-28 DIAGNOSIS — M25511 Pain in right shoulder: Secondary | ICD-10-CM | POA: Diagnosis not present

## 2023-09-28 DIAGNOSIS — M19041 Primary osteoarthritis, right hand: Secondary | ICD-10-CM

## 2023-09-28 DIAGNOSIS — M797 Fibromyalgia: Secondary | ICD-10-CM

## 2023-09-28 DIAGNOSIS — M65341 Trigger finger, right ring finger: Secondary | ICD-10-CM

## 2023-09-28 DIAGNOSIS — I10 Essential (primary) hypertension: Secondary | ICD-10-CM

## 2023-09-28 DIAGNOSIS — M5030 Other cervical disc degeneration, unspecified cervical region: Secondary | ICD-10-CM

## 2023-09-28 NOTE — Patient Instructions (Signed)

## 2023-11-02 NOTE — Progress Notes (Addendum)
Office Visit Note  Patient: Tammie [name]             Date of Birth: 1950-12-02           MRN: [id number]             PCP: Susann Givens PA-C Referring: Rosendo Gros Visit Date: 11/16/2023 Occupation: @GUAROCC @  Subjective:  Pain of the Right Shoulder   History of Present Illness: Tammie [name] is a 73 y.o. female with seropositive rheumatoid arthritis, osteoarthritis, degenerative disc disease and fibromyalgia.  She returns today after her last visit on September 28, 2023.  She continues to take hydroxychloroquine 200 mg p.o. twice daily Monday to Friday.  She continues to have some generalized pain in the neck, shoulders and and the hip region.  Not noticed any joint swelling.  She states her right shoulder has been very painful since the surgery.  She has difficulty raising her right arm.  He also complains of some discomfort in her left wrist.  Patient had thoracic 12 to sacral 1 laminectomy and fusion with L5-S1 transforaminal lumbar interbody fusion, pedicle screws, iliac fixation with allograft by Dr. October 03, 2023 Torrealba (orthopedic surgeon with [facility]) on October 03, 2023.  She had improvement in her lower back pain patient states that the pain is gradually getting better.  She has some muscular pain.  She states that she may undergo right total hip replacement in the near future.  Activities of Daily Living:  Patient reports morning stiffness for 24 hours.   Patient Reports nocturnal pain.  Difficulty dressing/grooming: Reports Difficulty climbing stairs: Reports Difficulty getting out of chair: Reports Difficulty using hands for taps, buttons, cutlery, and/or writing: Reports  Review of Systems  Constitutional:  Positive for fatigue.  HENT:  Negative for mouth sores and mouth dryness.   Eyes:  Negative for dryness.  Respiratory:  Negative for shortness of breath.   Cardiovascular:  Negative for chest pain and palpitations.   Gastrointestinal:  Negative for blood in stool, constipation and diarrhea.  Endocrine: Negative for increased urination.  Genitourinary:  Negative for involuntary urination.  Musculoskeletal:  Positive for joint pain, joint pain, myalgias, muscle weakness, morning stiffness and myalgias. Negative for gait problem, joint swelling and muscle tenderness.  Skin:  Positive for hair loss. Negative for color change, rash and sensitivity to sunlight.  Allergic/Immunologic: Negative for susceptible to infections.  Neurological:  Negative for dizziness and headaches.  Hematological:  Negative for swollen glands.  Psychiatric/Behavioral:  Positive for sleep disturbance. Negative for depressed mood. The patient is not nervous/anxious.     PMFS History:  Patient Active Problem List   Diagnosis Date Noted   DDD (degenerative disc disease), lumbar 02/08/2017   DDD (degenerative disc disease), cervical 02/08/2017   Primary osteoarthritis of both hips 02/08/2017   Bilateral primary osteoarthritis of knee 02/08/2017   History of renal insufficiency 02/08/2017   Plantar fasciitis 02/08/2017   History of bilateral total hip arthroplasty 02/08/2017   History of total knee replacement 02/08/2017   History of depression 02/08/2017   Trapezius muscle spasm 07/18/2016   Bilateral sacroiliitis (HCC) 07/18/2016   Rheumatoid arthritis involving multiple sites with positive rheumatoid factor (HCC) 07/17/2016   High risk medications (not anticoagulants) long-term use 07/17/2016   Fibromyalgia 07/17/2016   Fatigue 07/17/2016   Insomnia 07/17/2016    Past Medical History:  Diagnosis Date   CRI (chronic renal insufficiency)    DDD (degenerative disc disease), cervical  DDD (degenerative disc disease), lumbar    Depression    Fibromyalgia    OA (osteoarthritis) of knee    Osteoarthritis of hip    Plantar fasciitis, bilateral    Rheumatoid arthritis (HCC)     History reviewed. No pertinent family  history. Past Surgical History:  Procedure Laterality Date   ANKLE FRACTURE SURGERY     APPENDECTOMY     CESAREAN SECTION     x2   CHOLECYSTECTOMY     ELBOW SURGERY     JOINT REPLACEMENT     BIL knee    JOINT REPLACEMENT     BIL hip   KNEE ARTHROPLASTY     NECK SURGERY     REPLACEMENT TOTAL KNEE Bilateral    SPINE SURGERY     TOTAL HIP ARTHROPLASTY Bilateral    WRIST SURGERY Right    Social History   Social History Narrative   Not on file   Immunization History  Administered Date(s) Administered   Influenza,inj,Quad PF,6+ Mos 07/13/2016   Influenza-Unspecified 07/13/2016   Pneumococcal Conjugate-13 05/29/2017     Objective: Vital Signs: BP 127/79 (BP Location: Left Arm, Patient Position: Sitting, Cuff Size: Normal)   Pulse 69   Resp 16   Ht 5\' 6"  (1.676 m)   Wt 166 lb (75.3 kg)   BMI 26.79 kg/m    Physical Exam Vitals and nursing note reviewed.  Constitutional:      Appearance: She is well-developed.  HENT:     Head: Normocephalic and atraumatic.  Eyes:     Conjunctiva/sclera: Conjunctivae normal.  Cardiovascular:     Rate and Rhythm: Normal rate and regular rhythm.     Heart sounds: Normal heart sounds.  Pulmonary:     Effort: Pulmonary effort is normal.     Breath sounds: Normal breath sounds.  Abdominal:     General: Bowel sounds are normal.     Palpations: Abdomen is soft.  Musculoskeletal:     Cervical back: Normal range of motion.  Lymphadenopathy:     Cervical: No cervical adenopathy.  Skin:    General: Skin is warm and dry.     Capillary Refill: Capillary refill takes less than 2 seconds.  Neurological:     Mental Status: She is alert and oriented to person, place, and time.  Psychiatric:        Behavior: Behavior normal.      Musculoskeletal Exam: Patient had limited lateral rotation of the cervical spine with some stiffness.  She had recent surgery on the lumbar spine with limited painful range of motion.  Right shoulder joint  abduction and forward flexion was limited to about 30 degrees.  Left shoulder joint was in full range of motion.  Elbow joints, wrist joints, MCPs PIPs and DIPs Juengel range of motion.  Hip joints were replaced.  Hip joints had limited painful range of motion.  Knee joints in good range of motion without any warmth swelling or effusion.  Knee joints were replaced.  There was no tenderness over ankles or MTPs.  CDAI Exam: CDAI Score: 10  Patient Global: 60 / 100; Provider Global: 30 / 100 Swollen: 0 ; Tender: 2  Joint Exam 11/16/2023      Right  Left  Glenohumeral   Tender     Lumbar Spine   Tender        Investigation: No additional findings.  Imaging: No results found.  Recent Labs: Lab Results  Component Value Date   WBC 4.9 04/26/2023  HGB 11.9 04/26/2023   PLT 272 04/26/2023   NA 141 04/26/2023   K 4.5 04/26/2023   CL 106 04/26/2023   CO2 34 (H) 04/26/2023   GLUCOSE 84 04/26/2023   BUN 23 04/26/2023   CREATININE 1.12 (H) 04/26/2023   BILITOT 0.4 04/26/2023   ALKPHOS 98 02/16/2017   AST 32 04/26/2023   ALT 22 04/26/2023   PROT 6.5 04/26/2023   PROT 6.7 04/26/2023   ALBUMIN 3.8 02/16/2017   CALCIUM 9.3 04/26/2023   GFRAA 64 09/29/2020     May 15, 2023 SPEP normal, PTH 121, vitamin D 37, phosphorus normal, IFE normal.  Speciality Comments: PLQ Eye Exam: 09/26/2022 WNL@ [facility] Follow up in 1 year Fosamax 2020-2023 by another provider History of radiation therapy per patient  Procedures:  Large Joint Inj on 11/16/2023 8:30 AM Indications: pain Details: 27 G 1.5 in needle, posterior approach  Arthrogram: No  Medications: 1.5 mL lidocaine 1 %; 40 mg triamcinolone acetonide 40 MG/ML Aspirate: 0 mL Outcome: tolerated well, no immediate complications Procedure, treatment alternatives, risks and benefits explained, specific risks discussed. Consent was given by the patient. Immediately prior to procedure a time out was called to verify the correct  patient, procedure, equipment, support staff and site/side marked as required. Patient was prepped and draped in the usual sterile fashion.     Allergies: Patient has no known allergies.   Assessment / Plan:     Visit Diagnoses: Rheumatoid arthritis involving multiple sites with positive rheumatoid factor (HCC) -patient denies having joint swelling.  No synovitis was noted on the examination.  She continues to have generalized pain and discomfort in multiple joints and muscles.  She continues to be on hydroxychloroquine 200 mg p.o. twice daily without any interruption.  Plan: hydroxychloroquine (PLAQUENIL) 200 MG tablet  High risk medications (not anticoagulants) long-term use - Plaquenil 200 mg 1 tablet by mouth twice daily Monday through Friday only. PLQ Eye Exam: 09/26/2022 -October 04, 2023 CBC WBC 7.3, hemoglobin 8.6, platelets 166, BMP normal calcium low at 6.5.  These are postop labs.  Her hemoglobin and calcium are low.  I will repeat labs today.  Plan: CBC with Differential/Platelet, COMPLETE METABOLIC PANEL WITH GFR  Closed fracture of right wrist, sequela  Chronic right shoulder pain -she has been having pain and discomfort in her right shoulder which is frozen.  She also has difficulty sleeping on the right side.  She wants to have cortisone injection today.  X-rays in the past showed acromioclavicular arthritis.  Which were reviewed.  After informed consent was obtained right glenohumeral joint was injected with lidocaine and Kenalog as described above.  Patient tolerated procedure well.  Postprocedure instructions were given.  A handout on shoulder exercises was given.  Trigger finger, right ring finger-she has intermittent triggering which is currently not symptomatic.  Primary osteoarthritis of both hands-she has PIP and DIP thickening with no synovitis.  History of bilateral total hip arthroplasty - Patient states that she had radiation therapy to her left hip after an infection many  years ago.  She has limited range of motion of bilateral hip joints.  Patient states she will need right hip revision in the future.  History of total bilateral knee replacement-both knee joints in good range of motion without discomfort.  DDD (degenerative disc disease), cervical-she has limited lateral rotation of the cervical spine.  Spondylosis of lumbar spine-thoracic 12 to sacral 1 laminectomy and fusion with L5-S1 transforaminal lumbar interbody fusion, pedicle screws,  iliac fixation with allograft by Dr. October 03, 2023 Torrealba (orthopedic surgeon with [facility]) on October 03, 2023.  Patient noted improvement in her lumbar spine pain.  Fibromyalgia -she continues to have generalized pain and discomfort.  She is on Percocet every 4 hours as needed for pain relief and takes pregabalin 100 mg p.o. nightly  Primary insomnia-good sleep hygiene was discussed.  She also has nocturnal pain.  Other fatigue-related to fibromyalgia and insomnia.  Age-related osteoporosis without current pathological fracture - Fosamax from 2020 until 2023 by her PCP.December 14, 2022 DEXA scan T-score -2.8, BMD 0.524 left one third radius -4% change from February 23, 2017 -I did detailed discussion with the patient regarding different anabolic agents.  Patient gives questionable history of radiation therapy to her left hip.  Patient states she had radiation therapy to her left hip by oncology department due after left total hip replacement.  I advised her to forward those records to [place].  I discussed the option of starting her on Evenity.  Patient has no cardiovascular risk factors and no history of DVTs.  Side effects were discussed and a handout was given.  Patient wants to proceed with Evenity.  I will obtain labs today prior to starting her on any treatment.  She has low calcium recently.  Plan: VITAMIN D 25 Hydroxy (Vit-D Deficiency, Fractures), Parathyroid hormone, intact (no Ca)  Encounter for medication management  - will start her on Evenity after the spine surgery.  History of depression  History of renal insufficiency  Essential hypertension-blood pressure was normal today.  Orders: Orders Placed This Encounter  Procedures   CBC with Differential/Platelet   COMPLETE METABOLIC PANEL WITH GFR   VITAMIN D 25 Hydroxy (Vit-D Deficiency, Fractures)   Parathyroid hormone, intact (no Ca)   Meds ordered this encounter  Medications   hydroxychloroquine (PLAQUENIL) 200 MG tablet    Sig: TAKE 1 TABLET BY MOUTH TWICE  DAILY MONDAY THROUGH FRIDAY ONLY    Dispense:  120 tablet    Refill:  0     Follow-Up Instructions: Return in about 3 months (around 02/13/2024) for Osteoarthritis, Rheumatoid arthritis, Osteoporosis.   Pollyann Savoy, MD  Note - This record has been created using Animal nutritionist.  Chart creation errors have been sought, but may not always  have been located. Such creation errors do not reflect on  the standard of medical care.

## 2023-11-16 ENCOUNTER — Ambulatory Visit: Payer: Medicare Other | Attending: Rheumatology | Admitting: Rheumatology

## 2023-11-16 ENCOUNTER — Encounter: Payer: Self-pay | Admitting: Rheumatology

## 2023-11-16 VITALS — BP 127/79 | HR 69 | Resp 16 | Ht 66.0 in | Wt 166.0 lb

## 2023-11-16 DIAGNOSIS — F5101 Primary insomnia: Secondary | ICD-10-CM

## 2023-11-16 DIAGNOSIS — M0579 Rheumatoid arthritis with rheumatoid factor of multiple sites without organ or systems involvement: Secondary | ICD-10-CM

## 2023-11-16 DIAGNOSIS — M47816 Spondylosis without myelopathy or radiculopathy, lumbar region: Secondary | ICD-10-CM

## 2023-11-16 DIAGNOSIS — M25511 Pain in right shoulder: Secondary | ICD-10-CM

## 2023-11-16 DIAGNOSIS — Z87448 Personal history of other diseases of urinary system: Secondary | ICD-10-CM

## 2023-11-16 DIAGNOSIS — M797 Fibromyalgia: Secondary | ICD-10-CM

## 2023-11-16 DIAGNOSIS — M810 Age-related osteoporosis without current pathological fracture: Secondary | ICD-10-CM

## 2023-11-16 DIAGNOSIS — Z96653 Presence of artificial knee joint, bilateral: Secondary | ICD-10-CM

## 2023-11-16 DIAGNOSIS — Z96643 Presence of artificial hip joint, bilateral: Secondary | ICD-10-CM

## 2023-11-16 DIAGNOSIS — Z8659 Personal history of other mental and behavioral disorders: Secondary | ICD-10-CM

## 2023-11-16 DIAGNOSIS — M19041 Primary osteoarthritis, right hand: Secondary | ICD-10-CM

## 2023-11-16 DIAGNOSIS — M5030 Other cervical disc degeneration, unspecified cervical region: Secondary | ICD-10-CM

## 2023-11-16 DIAGNOSIS — G8929 Other chronic pain: Secondary | ICD-10-CM

## 2023-11-16 DIAGNOSIS — S62101S Fracture of unspecified carpal bone, right wrist, sequela: Secondary | ICD-10-CM

## 2023-11-16 DIAGNOSIS — R5383 Other fatigue: Secondary | ICD-10-CM

## 2023-11-16 DIAGNOSIS — I10 Essential (primary) hypertension: Secondary | ICD-10-CM

## 2023-11-16 DIAGNOSIS — Z79899 Other long term (current) drug therapy: Secondary | ICD-10-CM | POA: Diagnosis not present

## 2023-11-16 DIAGNOSIS — M19042 Primary osteoarthritis, left hand: Secondary | ICD-10-CM

## 2023-11-16 DIAGNOSIS — M65341 Trigger finger, right ring finger: Secondary | ICD-10-CM

## 2023-11-16 MED ORDER — TRIAMCINOLONE ACETONIDE 40 MG/ML IJ SUSP
40.0000 mg | INTRAMUSCULAR | Status: AC | PRN
Start: 2023-11-16 — End: 2023-11-16
  Administered 2023-11-16: 40 mg via INTRA_ARTICULAR

## 2023-11-16 MED ORDER — LIDOCAINE HCL 1 % IJ SOLN
1.5000 mL | INTRAMUSCULAR | Status: AC | PRN
Start: 2023-11-16 — End: 2023-11-16
  Administered 2023-11-16: 1.5 mL

## 2023-11-16 MED ORDER — HYDROXYCHLOROQUINE SULFATE 200 MG PO TABS: ORAL_TABLET | ORAL | 0 refills | Status: DC

## 2023-11-16 NOTE — Patient Instructions (Addendum)
Romosozumab Injection What is this medication? ROMOSOZUMAB (roe moe SOZ ue mab) prevents and treats osteoporosis. It works by Interior and spatial designer stronger and less likely to break (fracture). It is a monoclonal antibody. This medicine may be used for other purposes; ask your health care provider or pharmacist if you have questions. COMMON BRAND NAME(S): EVENITY What should I tell my care team before I take this medication? They need to know if you have any of these conditions: Dental disease Heart attack Heart disease Kidney problems Low levels of calcium in the blood On dialysis Stroke Wear dentures An unusual or allergic reaction to romosozumab, other medications, foods, dyes or preservatives Pregnant or trying to get pregnant Breast-feeding How should I use this medication? This medication is injected under the skin. It is given by your care team in a hospital or clinic setting. A special MedGuide will be given to you by the pharmacist with each prescription and refill. Be sure to read this information carefully each time. Talk to your care team about the use of this medication in children. Special care may be needed. Overdosage: If you think you have taken too much of this medicine contact a poison control center or emergency room at once. NOTE: This medicine is only for you. Do not share this medicine with others. What if I miss a dose? Keep appointments for follow-up doses. It is important not to miss your dose. Call your care team if you are unable to keep an appointment. What may interact with this medication? Interactions are not expected. This list may not describe all possible interactions. Give your health care provider a list of all the medicines, herbs, non-prescription drugs, or dietary supplements you use. Also tell them if you smoke, drink alcohol, or use illegal drugs. Some items may interact with your medicine. What should I watch for while using this medication? Your  condition will be monitored carefully while you are receiving this medication. You may need bloodwork while taking this medication. You should make sure you get enough calcium and vitamin D while you are taking this medication. Discuss the foods you eat and the vitamins you take with your care team. Some people who take this medication have severe bone, joint, or muscle pain. This medication may also increase your risk for jaw problems or a broken thigh bone. Tell your care team right away if you have severe pain in your jaw, bones, joints, or muscles. Tell you care team if you have any pain that does not go away or that gets worse. Tell your dentist and dental surgeon that you are taking this medication. You should not have major dental surgery while on this medication. See your dentist to have a dental exam and fix any dental problems before starting this medication. Take good care of your teeth while on this medication. Make sure you see your dentist for regular follow-up appointments. What side effects may I notice from receiving this medication? Side effects that you should report to your care team as soon as possible: Allergic reactions or angioedema--skin rash, itching or hives, swelling of the face, eyes, lips, tongue, arms, or legs, trouble swallowing or breathing Heart attack--pain or tightness in the chest, shoulders, arms, or jaw, nausea, shortness of breath, cold or clammy skin, feeling faint or lightheaded Low calcium level--muscle pain or cramps, confusion, tingling, or numbness in the hands or feet Osteonecrosis of the jaw--pain, swelling, or redness in the mouth, numbness of the jaw, poor healing after dental work,  unusual discharge from the mouth, visible bones in the mouth Severe bone, joint, or muscle pain Stroke--sudden numbness or weakness of the face, arm, or leg, trouble speaking, confusion, trouble walking, loss of balance or coordination, dizziness, severe headache, change in  vision Side effects that usually do not require medical attention (report to your care team if they continue or are bothersome): Headache Joint pain Muscle spasms Pain, redness, or irritation at injection site Swelling of the ankles, hands, or feet This list may not describe all possible side effects. Call your doctor for medical advice about side effects. You may report side effects to FDA at 1-800-FDA-1088. Where should I keep my medication? This medication is given in a hospital or clinic. It will not be stored at home. NOTE: This sheet is a summary. It may not cover all possible information. If you have questions about this medicine, talk to your doctor, pharmacist, or health care provider.  2024 Elsevier/Gold Standard (2021-10-12 00:00:00)  Vaccines You are taking a medication(s) that can suppress your immune system.  The following immunizations are recommended: Flu annually Covid-19  Td/Tdap (tetanus, diphtheria, pertussis) every 10 years Pneumonia (Prevnar 15 then Pneumovax 23 at least 1 year apart.  Alternatively, can take Prevnar 20 without needing additional dose) Shingrix: 2 doses from 4 weeks to 6 months apart  Please check with your PCP to make sure you are up to date.  Shoulder Exercises Ask your health care provider which exercises are safe for you. Do exercises exactly as told by your health care provider and adjust them as directed. It is normal to feel mild stretching, pulling, tightness, or discomfort as you do these exercises. Stop right away if you feel sudden pain or your pain gets worse. Do not begin these exercises until told by your health care provider. Stretching exercises External rotation and abduction This exercise is sometimes called corner stretch. The exercise rotates your arm outward (external rotation) and moves your arm out from your body (abduction). Stand in a doorway with one of your feet slightly in front of the other. This is called a staggered  stance. If you cannot reach your forearms to the door frame, stand facing a corner of a room. Choose one of the following positions as told by your health care provider: Place your hands and forearms on the door frame above your head. Place your hands and forearms on the door frame at the height of your head. Place your hands on the door frame at the height of your elbows. Slowly move your weight onto your front foot until you feel a stretch across your chest and in the front of your shoulders. Keep your head and chest upright and keep your abdominal muscles tight. Hold for __________ seconds. To release the stretch, shift your weight to your back foot. Repeat __________ times. Complete this exercise __________ times a day. Extension, standing  Stand and hold a broomstick, a cane, or a similar object behind your back. Your hands should be a little wider than shoulder-width apart. Your palms should face away from your back. Keeping your elbows straight and your shoulder muscles relaxed, move the stick away from your body until you feel a stretch in your shoulders (extension). Avoid shrugging your shoulders while you move the stick. Keep your shoulder blades tucked down toward the middle of your back. Hold for __________ seconds. Slowly return to the starting position. Repeat __________ times. Complete this exercise __________ times a day. Range-of-motion exercises Pendulum  Stand near  a wall or a surface that you can hold onto for balance. Bend at the waist and let your left / right arm hang straight down. Use your other arm to support you. Keep your back straight and do not lock your knees. Relax your left / right arm and shoulder muscles, and move your hips and your trunk so your left / right arm swings freely. Your arm should swing because of the motion of your body, not because you are using your arm or shoulder muscles. Keep moving your hips and trunk so your arm swings in the following  directions, as told by your health care provider: Side to side. Forward and backward. In clockwise and counterclockwise circles. Continue each motion for __________ seconds, or for as long as told by your health care provider. Slowly return to the starting position. Repeat __________ times. Complete this exercise __________ times a day. Shoulder flexion, standing  Stand and hold a broomstick, a cane, or a similar object. Place your hands a little more than shoulder-width apart on the object. Your left / right hand should be palm-up, and your other hand should be palm-down. Keep your elbow straight and your shoulder muscles relaxed. Push the stick up with your healthy arm to raise your left / right arm in front of your body, and then over your head until you feel a stretch in your shoulder (flexion). Avoid shrugging your shoulder while you raise your arm. Keep your shoulder blade tucked down toward the middle of your back. Hold for __________ seconds. Slowly return to the starting position. Repeat __________ times. Complete this exercise __________ times a day. Shoulder abduction, standing  Stand and hold a broomstick, a cane, or a similar object. Place your hands a little more than shoulder-width apart on the object. Your left / right hand should be palm-up, and your other hand should be palm-down. Keep your elbow straight and your shoulder muscles relaxed. Push the object across your body toward your left / right side. Raise your left / right arm to the side of your body (abduction) until you feel a stretch in your shoulder. Do not raise your arm above shoulder height unless your health care provider tells you to do that. If directed, raise your arm over your head. Avoid shrugging your shoulder while you raise your arm. Keep your shoulder blade tucked down toward the middle of your back. Hold for __________ seconds. Slowly return to the starting position. Repeat __________ times. Complete  this exercise __________ times a day. Internal rotation  Place your left / right hand behind your back, palm-up. Use your other hand to dangle an exercise band, a broomstick, or a similar object over your shoulder. Grasp the band with your left / right hand so you are holding on to both ends. Gently pull up on the band until you feel a stretch in the front of your left / right shoulder. The movement of your arm toward the center of your body is called internal rotation. Avoid shrugging your shoulder while you raise your arm. Keep your shoulder blade tucked down toward the middle of your back. Hold for __________ seconds. Release the stretch by letting go of the band and lowering your hands. Repeat __________ times. Complete this exercise __________ times a day. Strengthening exercises External rotation  Sit in a stable chair without armrests. Secure an exercise band to a stable object at elbow height on your left / right side. Place a soft object, such as a folded  towel or a small pillow, between your left / right upper arm and your body to move your elbow about 4 inches (10 cm) away from your side. Hold the end of the exercise band so it is tight and there is no slack. Keeping your elbow pressed against the soft object, slowly move your forearm out, away from your abdomen (external rotation). Keep your body steady so only your forearm moves. Hold for __________ seconds. Slowly return to the starting position. Repeat __________ times. Complete this exercise __________ times a day. Shoulder abduction  Sit in a stable chair without armrests, or stand up. Hold a __________ lb / kg weight in your left / right hand, or hold an exercise band with both hands. Start with your arms straight down and your left / right palm facing in, toward your body. Slowly lift your left / right hand out to your side (abduction). Do not lift your hand above shoulder height unless your health care provider tells you  that this is safe. Keep your arms straight. Avoid shrugging your shoulder while you do this movement. Keep your shoulder blade tucked down toward the middle of your back. Hold for __________ seconds. Slowly lower your arm, and return to the starting position. Repeat __________ times. Complete this exercise __________ times a day. Shoulder extension  Sit in a stable chair without armrests, or stand up. Secure an exercise band to a stable object in front of you so it is at shoulder height. Hold one end of the exercise band in each hand. Straighten your elbows and lift your hands up to shoulder height. Squeeze your shoulder blades together as you pull your hands down to the sides of your thighs (extension). Stop when your hands are straight down by your sides. Do not let your hands go behind your body. Hold for __________ seconds. Slowly return to the starting position. Repeat __________ times. Complete this exercise __________ times a day. Shoulder row  Sit in a stable chair without armrests, or stand up. Secure an exercise band to a stable object in front of you so it is at chest height. Hold one end of the exercise band in each hand. Position your palms so that your thumbs are facing the ceiling (neutral position). Bend each of your elbows to a 90-degree angle (right angle) and keep your upper arms at your sides. Step back or move the chair back until the band is tight and there is no slack. Slowly pull your elbows back behind you. Hold for __________ seconds. Slowly return to the starting position. Repeat __________ times. Complete this exercise __________ times a day. Shoulder press-ups  Sit in a stable chair that has armrests. Sit upright, with your feet flat on the floor. Put your hands on the armrests so your elbows are bent and your fingers are pointing forward. Your hands should be about even with the sides of your body. Push down on the armrests and use your arms to lift  yourself off the chair. Straighten your elbows and lift yourself up as much as you comfortably can. Move your shoulder blades down, and avoid letting your shoulders move up toward your ears. Keep your feet on the ground. As you get stronger, your feet should support less of your body weight as you lift yourself up. Hold for __________ seconds. Slowly lower yourself back into the chair. Repeat __________ times. Complete this exercise __________ times a day. Wall push-ups  Stand so you are facing a stable wall. Your  feet should be about one arm-length away from the wall. Lean forward and place your palms on the wall at shoulder height. Keep your feet flat on the floor as you bend your elbows and lean forward toward the wall. Hold for __________ seconds. Straighten your elbows to push yourself back to the starting position. Repeat __________ times. Complete this exercise __________ times a day. This information is not intended to replace advice given to you by your health care provider. Make sure you discuss any questions you have with your health care provider. Document Revised: 11/01/2021 Document Reviewed: 11/01/2021 Elsevier Patient Education  2024 [employer].

## 2023-11-17 LAB — COMPLETE METABOLIC PANEL WITH GFR
AG Ratio: 1.7 (calc) (ref 1.0–2.5)
ALT: 8 U/L (ref 6–29)
AST: 15 U/L (ref 10–35)
Albumin: 4 g/dL (ref 3.6–5.1)
Alkaline phosphatase (APISO): 107 U/L (ref 37–153)
BUN/Creatinine Ratio: 17 (calc) (ref 6–22)
BUN: 17 mg/dL (ref 7–25)
CO2: 26 mmol/L (ref 20–32)
Calcium: 9.4 mg/dL (ref 8.6–10.4)
Chloride: 109 mmol/L (ref 98–110)
Creat: 1.02 mg/dL — ABNORMAL HIGH (ref 0.60–1.00)
Globulin: 2.4 g/dL (ref 1.9–3.7)
Glucose, Bld: 79 mg/dL (ref 65–99)
Potassium: 4.5 mmol/L (ref 3.5–5.3)
Sodium: 142 mmol/L (ref 135–146)
Total Bilirubin: 0.3 mg/dL (ref 0.2–1.2)
Total Protein: 6.4 g/dL (ref 6.1–8.1)
eGFR: 59 mL/min/{1.73_m2} — ABNORMAL LOW (ref >=60)

## 2023-11-17 LAB — CBC WITH DIFFERENTIAL/PLATELET
Absolute Lymphocytes: 981 {cells}/uL (ref 850–3900)
Absolute Monocytes: 376 {cells}/uL (ref 200–950)
Basophils Absolute: 32 {cells}/uL (ref 0–200)
Basophils Relative: 0.6 %
Eosinophils Absolute: 111 {cells}/uL (ref 15–500)
Eosinophils Relative: 2.1 %
HCT: 34.2 % — ABNORMAL LOW (ref 35.0–45.0)
Hemoglobin: 10.2 g/dL — ABNORMAL LOW (ref 11.7–15.5)
MCH: 27.7 pg (ref 27.0–33.0)
MCHC: 29.8 g/dL — ABNORMAL LOW (ref 32.0–36.0)
MCV: 92.9 fL (ref 80.0–100.0)
MPV: 9.6 fL (ref 7.5–12.5)
Monocytes Relative: 7.1 %
Neutro Abs: 3800 {cells}/uL (ref 1500–7800)
Neutrophils Relative %: 71.7 %
Platelets: 353 10*3/uL (ref 140–400)
RBC: 3.68 10*6/uL — ABNORMAL LOW (ref 3.80–5.10)
RDW: 12.3 % (ref 11.0–15.0)
Total Lymphocyte: 18.5 %
WBC: 5.3 10*3/uL (ref 3.8–10.8)

## 2023-11-17 LAB — PARATHYROID HORMONE, INTACT (NO CA): PTH: 86 pg/mL — ABNORMAL HIGH (ref 16–77)

## 2023-11-17 LAB — VITAMIN D 25 HYDROXY (VIT D DEFICIENCY, FRACTURES): Vit D, 25-Hydroxy: 36 ng/mL (ref 30–100)

## 2023-11-18 NOTE — Progress Notes (Signed)
 Hemoglobin is low at 10.2 most likely due to recent surgery.  Patient should take multivitamin with iron.  Creatinine is mildly elevated and stable.  PTH is mildly elevated.  Vitamin D is 32 which is normal.  We discussed Evenity at the last visit.  Please apply for Evenity.  Please forward a copy of her labs to her PCP.

## 2023-11-20 ENCOUNTER — Other Ambulatory Visit: Payer: Self-pay | Admitting: Pharmacist

## 2023-11-20 DIAGNOSIS — M810 Age-related osteoporosis without current pathological fracture: Secondary | ICD-10-CM | POA: Insufficient documentation

## 2023-11-20 NOTE — Progress Notes (Signed)
 Therapy plan placed for Evenity SQ [phone]) for [facility] to start benefits investigation  Diagnosis: age-related osteoporosis  Provider: Dr. Corliss Skains  Dose: 210mg  SQ every month  Last Clinic Visit: 11/16/2023 Next Clinic Visit: 02/13/2024  Chesley Mires, PharmD, MPH, BCPS, CPP Clinical Pharmacist (Rheumatology and Pulmonology)

## 2023-11-26 ENCOUNTER — Telehealth: Payer: Self-pay | Admitting: Pharmacy Technician

## 2023-11-26 NOTE — Telephone Encounter (Signed)
 Devki, Fyi note:  patient will be scheduled as soon as possible.  Auth Submission: APPROVED Site of care: Site of care: [facility] Payer: [facility] MEDICARE Medication & CPT/J Code(s) submitted: Evenity (Romosozumab) [id number] Route of submission (phone, fax, portal): PORTAL Phone # Fax # Auth type: Buy/Bill PB Units/visits requested: 12 DOSES Reference number: [id number] Approval from: 11/20/23 to 11/19/24

## 2023-11-30 ENCOUNTER — Ambulatory Visit

## 2023-11-30 VITALS — BP 146/75 | HR 61 | Temp 98.5°F | Resp 16 | Ht 66.0 in | Wt 167.2 lb

## 2023-11-30 DIAGNOSIS — M810 Age-related osteoporosis without current pathological fracture: Secondary | ICD-10-CM | POA: Diagnosis not present

## 2023-11-30 MED ORDER — ROMOSOZUMAB-AQQG 105 MG/1.17ML ~~LOC~~ SOSY
210.0000 mg | PREFILLED_SYRINGE | Freq: Once | SUBCUTANEOUS | Status: AC
  Administered 2023-11-30: 210 mg via SUBCUTANEOUS
  Filled 2023-11-30: qty 2.34

## 2023-11-30 NOTE — Progress Notes (Signed)
 Diagnosis: Osteoporosis  Provider:  Chilton Greathouse MD  Procedure: Injection  Evenity (Romosozumab-aqqg), Dose: 210 mg, Site: subcutaneous, Number of injections: 2  Injection Site(s): Left arm and Right arm  Post Care: Observation period completed  Discharge: Condition: Good, Destination: Home . AVS Provided  Performed by:  Nat Math, RN

## 2023-11-30 NOTE — Progress Notes (Signed)
 First Evenity scheduled for 11/30/2023

## 2023-11-30 NOTE — Patient Instructions (Signed)
 Romosozumab Injection  What is this medication?  ROMOSOZUMAB (roe moe SOZ ue mab) prevents and treats osteoporosis. It works by Interior and spatial designer stronger and less likely to break (fracture). It is a monoclonal antibody.  This medicine may be used for other purposes; ask your health care provider or pharmacist if you have questions.  COMMON BRAND NAME(S): EVENITY  What should I tell my care team before I take this medication?  They need to know if you have any of these conditions:  Dental disease  Heart attack  Heart disease  Kidney problems  Low levels of calcium in the blood  On dialysis  Stroke  Wear dentures  An unusual or allergic reaction to romosozumab, other medications, foods, dyes or preservatives  Pregnant or trying to get pregnant  Breast-feeding  How should I use this medication?  This medication is injected under the skin. It is given by your care team in a hospital or clinic setting.  A special MedGuide will be given to you by the pharmacist with each prescription and refill. Be sure to read this information carefully each time.  Talk to your care team about the use of this medication in children. Special care may be needed.  Overdosage: If you think you have taken too much of this medicine contact a poison control center or emergency room at once.  NOTE: This medicine is only for you. Do not share this medicine with others.  What if I miss a dose?  Keep appointments for follow-up doses. It is important not to miss your dose. Call your care team if you are unable to keep an appointment.  What may interact with this medication?  Interactions are not expected.  This list may not describe all possible interactions. Give your health care provider a list of all the medicines, herbs, non-prescription drugs, or dietary supplements you use. Also tell them if you smoke, drink alcohol, or use illegal drugs. Some items may interact with your medicine.  What should I watch for while using this medication?  Your  condition will be monitored carefully while you are receiving this medication.  You may need bloodwork while taking this medication.  You should make sure you get enough calcium and vitamin D while you are taking this medication. Discuss the foods you eat and the vitamins you take with your care team.  Some people who take this medication have severe bone, joint, or muscle pain. This medication may also increase your risk for jaw problems or a broken thigh bone. Tell your care team right away if you have severe pain in your jaw, bones, joints, or muscles. Tell you care team if you have any pain that does not go away or that gets worse.  Tell your dentist and dental surgeon that you are taking this medication. You should not have major dental surgery while on this medication. See your dentist to have a dental exam and fix any dental problems before starting this medication. Take good care of your teeth while on this medication. Make sure you see your dentist for regular follow-up appointments.  What side effects may I notice from receiving this medication?  Side effects that you should report to your care team as soon as possible:  Allergic reactions or angioedema--skin rash, itching or hives, swelling of the face, eyes, lips, tongue, arms, or legs, trouble swallowing or breathing  Heart attack--pain or tightness in the chest, shoulders, arms, or jaw, nausea, shortness of breath, cold or clammy skin,  feeling faint or lightheaded  Low calcium level--muscle pain or cramps, confusion, tingling, or numbness in the hands or feet  Osteonecrosis of the jaw--pain, swelling, or redness in the mouth, numbness of the jaw, poor healing after dental work, unusual discharge from the mouth, visible bones in the mouth  Severe bone, joint, or muscle pain  Stroke--sudden numbness or weakness of the face, arm, or leg, trouble speaking, confusion, trouble walking, loss of balance or coordination, dizziness, severe headache, change in  vision  Side effects that usually do not require medical attention (report to your care team if they continue or are bothersome):  Headache  Joint pain  Muscle spasms  Pain, redness, or irritation at injection site  Swelling of the ankles, hands, or feet  This list may not describe all possible side effects. Call your doctor for medical advice about side effects. You may report side effects to FDA at 1-800-FDA-1088.  Where should I keep my medication?  This medication is given in a hospital or clinic. It will not be stored at home.  NOTE: This sheet is a summary. It may not cover all possible information. If you have questions about this medicine, talk to your doctor, pharmacist, or health care provider.   2024 Elsevier/Gold Standard (2021-10-12 00:00:00)

## 2023-12-28 ENCOUNTER — Ambulatory Visit

## 2023-12-28 VITALS — BP 152/76 | HR 63 | Temp 97.8°F | Resp 14 | Ht 66.0 in | Wt 168.8 lb

## 2023-12-28 DIAGNOSIS — M810 Age-related osteoporosis without current pathological fracture: Secondary | ICD-10-CM

## 2023-12-28 MED ORDER — ROMOSOZUMAB-AQQG 105 MG/1.17ML ~~LOC~~ SOSY
210.0000 mg | PREFILLED_SYRINGE | Freq: Once | SUBCUTANEOUS | Status: AC
  Administered 2023-12-28: 210 mg via SUBCUTANEOUS
  Filled 2023-12-28: qty 2.34

## 2023-12-28 NOTE — Progress Notes (Signed)
 Diagnosis: Osteoporosis  Provider:  Chilton Greathouse MD  Procedure: Injection  Evenity (Romosozumab-aqqg), Dose: 210 mg, Site: subcutaneous, Number of injections: 2  Injection Site(s): Left arm and Right arm  Post Care: Patient declined observation  Discharge: Condition: Good, Destination: Home . AVS Declined  Performed by:  Loney Hering, LPN

## 2024-01-01 ENCOUNTER — Ambulatory Visit: Payer: Medicare Other | Admitting: Rheumatology

## 2024-01-25 ENCOUNTER — Ambulatory Visit

## 2024-01-25 MED ORDER — ROMOSOZUMAB-AQQG 105 MG/1.17ML ~~LOC~~ SOSY
210.0000 mg | PREFILLED_SYRINGE | Freq: Once | SUBCUTANEOUS | Status: DC
Start: 2024-01-25 — End: 2024-02-15

## 2024-02-01 NOTE — Progress Notes (Signed)
 Office Visit Note  Patient: Tammie [name]             Date of Birth: 1951/07/10           MRN: [id number]             PCP: Gustav Lehmann Referring: Thelbert Finner, PA-C Visit Date: 02/13/2024 Occupation: @GUAROCC @  Subjective:  Pain in multiple joints   History of Present Illness: Tammie [name] is a 73 y.o. female with history of seropositive rheumatoid arthritis.  Patient remains on Plaquenil  200 mg 1 tablet by mouth twice daily Monday through Friday only.  She is tolerating Plaquenil  without any side effects and has not had any recent gaps in therapy.  Patient continues to have pain involving multiple joints.  Her pain has been most severe in her right shoulder and lower back.  She requested a repeat injection in the right shoulder today.  She has difficulty mobility due to the severity of discomfort in her lower back.  Patient states that she will be having right hip surgery and is following up with the orthopedist in June to discuss a surgical date.  Patient states that she was started on Evenity  on 11/30/2023.  She has noticed increased joint pain after each Evenity  injection and does not want to proceed with future Evenity  injections.      Activities of Daily Living:  Patient reports morning stiffness for 2-3 hours.   Patient Reports nocturnal pain.  Difficulty dressing/grooming: Reports Difficulty climbing stairs: Reports Difficulty getting out of chair: Reports Difficulty using hands for taps, buttons, cutlery, and/or writing: Reports  Review of Systems  Constitutional:  Positive for fatigue.  HENT:  Positive for mouth sores. Negative for mouth dryness.   Eyes:  Negative for dryness.  Respiratory:  Negative for shortness of breath.   Cardiovascular:  Negative for chest pain and palpitations.  Gastrointestinal:  Positive for constipation and diarrhea. Negative for blood in stool.  Endocrine: Negative for increased urination.  Genitourinary:  Negative  for involuntary urination.  Musculoskeletal:  Positive for joint pain, gait problem, joint pain, joint swelling, myalgias, muscle weakness, morning stiffness, muscle tenderness and myalgias.  Skin:  Positive for color change and hair loss. Negative for rash and sensitivity to sunlight.  Allergic/Immunologic: Negative for susceptible to infections.  Neurological:  Negative for dizziness and headaches.  Hematological:  Negative for swollen glands.  Psychiatric/Behavioral:  Positive for depressed mood and sleep disturbance. The patient is not nervous/anxious.     PMFS History:  Patient Active Problem List   Diagnosis Date Noted   Age related osteoporosis 11/20/2023   DDD (degenerative disc disease), lumbar 02/08/2017   DDD (degenerative disc disease), cervical 02/08/2017   Primary osteoarthritis of both hips 02/08/2017   Bilateral primary osteoarthritis of knee 02/08/2017   History of renal insufficiency 02/08/2017   Plantar fasciitis 02/08/2017   History of bilateral total hip arthroplasty 02/08/2017   History of total knee replacement 02/08/2017   History of depression 02/08/2017   Trapezius muscle spasm 07/18/2016   Bilateral sacroiliitis (HCC) 07/18/2016   Rheumatoid arthritis involving multiple sites with positive rheumatoid factor (HCC) 07/17/2016   High risk medications (not anticoagulants) long-term use 07/17/2016   Fibromyalgia 07/17/2016   Fatigue 07/17/2016   Insomnia 07/17/2016    Past Medical History:  Diagnosis Date   CRI (chronic renal insufficiency)    DDD (degenerative disc disease), cervical    DDD (degenerative disc disease), lumbar    Depression  Fibromyalgia    OA (osteoarthritis) of knee    Osteoarthritis of hip    Plantar fasciitis, bilateral    Rheumatoid arthritis (HCC)     History reviewed. No pertinent family history. Past Surgical History:  Procedure Laterality Date   ANKLE FRACTURE SURGERY     APPENDECTOMY     CESAREAN SECTION     x2    CHOLECYSTECTOMY     ELBOW SURGERY     JOINT REPLACEMENT     BIL knee    JOINT REPLACEMENT     BIL hip   KNEE ARTHROPLASTY     NECK SURGERY     REPLACEMENT TOTAL KNEE Bilateral    SPINE SURGERY     TOTAL HIP ARTHROPLASTY Bilateral    WRIST SURGERY Right    Social History   Social History Narrative   Not on file   Immunization History  Administered Date(s) Administered   Influenza,inj,Quad PF,6+ Mos 07/13/2016   Influenza-Unspecified 07/13/2016   Pneumococcal Conjugate-13 05/29/2017     Objective: Vital Signs: BP 135/80 (BP Location: Left Arm, Patient Position: Sitting, Cuff Size: Normal)   Pulse (!) 57   Resp 16   Ht 5\' 6"  (1.676 m)   Wt 172 lb (78 kg)   BMI 27.76 kg/m    Physical Exam Vitals and nursing note reviewed.  Constitutional:      Appearance: She is well-developed.  HENT:     Head: Normocephalic and atraumatic.  Eyes:     Conjunctiva/sclera: Conjunctivae normal.  Cardiovascular:     Rate and Rhythm: Normal rate and regular rhythm.     Heart sounds: Normal heart sounds.  Pulmonary:     Effort: Pulmonary effort is normal.     Breath sounds: Normal breath sounds.  Abdominal:     General: Bowel sounds are normal.     Palpations: Abdomen is soft.  Musculoskeletal:     Cervical back: Normal range of motion.  Lymphadenopathy:     Cervical: No cervical adenopathy.  Skin:    General: Skin is warm and dry.     Capillary Refill: Capillary refill takes less than 2 seconds.  Neurological:     Mental Status: She is alert and oriented to person, place, and time.  Psychiatric:        Behavior: Behavior normal.      Musculoskeletal Exam: C-spine has painful limited range of motion with lateral rotation.  Limited mobility and discomfort with range of motion of the lumbar spine.  Right shoulder has limited abduction and forward flexion to about 30 degrees.  Left shoulder has full range of motion.  Elbow joints, wrist joints, MCPs, PIPs, DIPs have good range of  motion with no synovitis.  PIP and DIP thickening consistent with osteoarthritis of both hands.  Knee replacements have good range of motion with no warmth or effusion.  Ankle joints have good range of motion with no tenderness or joint swelling.  CDAI Exam: CDAI Score: -- Patient Global: --; Provider Global: -- Swollen: --; Tender: -- Joint Exam 02/13/2024   No joint exam has been documented for this visit   There is currently no information documented on the homunculus. Go to the Rheumatology activity and complete the homunculus joint exam.  Investigation: No additional findings.  Imaging: No results found.  Recent Labs: Lab Results  Component Value Date   WBC 5.3 11/16/2023   HGB 10.2 (L) 11/16/2023   PLT 353 11/16/2023   NA 142 11/16/2023   K 4.5 11/16/2023  CL 109 11/16/2023   CO2 26 11/16/2023   GLUCOSE 79 11/16/2023   BUN 17 11/16/2023   CREATININE 1.02 (H) 11/16/2023   BILITOT 0.3 11/16/2023   ALKPHOS 98 02/16/2017   AST 15 11/16/2023   ALT 8 11/16/2023   PROT 6.4 11/16/2023   ALBUMIN 3.8 02/16/2017   CALCIUM 9.4 11/16/2023   GFRAA 64 09/29/2020    Speciality Comments: PLQ Eye Exam: 09/26/2022 WNL@ [facility] Follow up in 1 year Fosamax 2020-2023 by another provider History of radiation therapy per patient  Procedures:  Large Joint Inj: R subacromial bursa on 02/13/2024 1:47 PM Indications: pain Details: 27 G 1.5 in needle, posterior approach  Arthrogram: No  Medications: 1.5 mL lidocaine  1 %; 40 mg triamcinolone  acetonide 40 MG/ML Aspirate: 0 mL Outcome: tolerated well, no immediate complications Procedure, treatment alternatives, risks and benefits explained, specific risks discussed. Consent was given by the patient. Immediately prior to procedure a time out was called to verify the correct patient, procedure, equipment, support staff and site/side marked as required. Patient was prepped and draped in the usual sterile fashion.      Allergies: Patient has no known allergies.    Assessment / Plan:     Visit Diagnoses: Rheumatoid arthritis involving multiple sites with positive rheumatoid factor (HCC): No synovitis was noted on examination today.  Her rheumatoid arthritis appears well-controlled taking Plaquenil  200 mg 1 tablet by mouth twice daily Monday through Friday.  She continues to have chronic pain involving multiple joints from previous surgical interventions and fibromyalgia.  She requested a repeat right shoulder joint cortisone injection today despite having a glenohumeral joint injection on 11/16/2023.  A right subacromial bursa cortisone injection was performed today.  Procedure note was completed above.   She is tolerating Plaquenil  without any side effects and has not had any recent gaps in therapy.  No medication changes will be made at this time.  She was advised to notify us  if she develops any new or worsening symptoms.  She will follow-up in the office in 5 months or sooner if needed.  High risk medications (not anticoagulants) long-term use - Plaquenil  200 mg 1 tablet by mouth twice daily Monday through Friday only.  CBC and CMP updated on 11/16/23.  Her next lab work will be due in July and every 5 months.   PLQ Eye Exam: 09/26/2022 WNL@ [facility] Follow up in 1 year.  According to the patient she has had an updated Plaquenil  examination.  Will call to obtain these records.  Closed fracture of right wrist, sequela: No active inflammation noted.   Chronic right shoulder pain - X-rays showed acromioclavicular arthritis in 2023.  Patient had a right glenohumeral joint cortisone injection performed on 11/16/2023 patient had a temporary relief.  Patient is having persistent discomfort in the right shoulder and has requested a repeat right shoulder injection today.  A right subacromial bursa cortisone injection was performed today.  She tolerated the procedure well.  Procedure note was completed above.   Aftercare was discussed.  Recommend evaluation by orthopedics if her symptoms persist or worsen.  Trigger finger, right ring finger: Not currently symptomatic.    Primary osteoarthritis of both hands: She has PIP and DIP thickening consistent with osteoarthritis of both hands.  No synovitis noted.  History of bilateral total hip arthroplasty: Patient will be following up with her orthopedist in June 2025 to discuss proceeding with surgical intervention for the right hip.  History of total  bilateral knee replacement: Doing well.  No warmth or effusion noted.  DDD (degenerative disc disease), cervical: C-spine has limited range of motion and discomfort with lateral rotation.  Spondylosis of lumbar spine - Thoracic 12 to sacral 1 laminectomy and fusion with L5-S1 transforaminal lumbar interbody fusion, pedicle screws, iliac fixation with allograft by Dr. Nadia Aurora (orthopedic surgeon with [facility]) on 10/03/2023.   Fibromyalgia - She has generalized hyperalgesia and positive tender points on exam.  She is on Percocet every 4 hours as needed for pain relief and takes pregabalin 100 mg p.o. nightly  Primary insomnia: She has difficulty sleeping at night due to nocturnal pain.  Other fatigue: Chronic, stable.  Age-related osteoporosis without current pathological fracture - Fosamax from 2020 until 2023 by her PCP. 12/14/2022 DEXA scan T-score -2.8, BMD 0.524 left one third radius -4% change from February 23, 2017.   Questionable history of radiation of the left hip.  The patient was initiated on Evenity  on 11/30/2023 and had the second dose administered on 12/28/2023.  According to the patient after each dose she noticed increased arthralgias.  She does not want to proceed with a third dose of Evenity  due to side effects and cost.    Encounter for medication management - Evenity  x2 doses--11/30/2023 and 12/28/2023.  Patient does not plan to proceed with a third dose of Evenity  due to experiencing increased  arthralgias.  According to the patient she was also responsible for $1,000 per dose.   Other medical conditions are listed as follows:  History of renal insufficiency  History of depression  Essential hypertension: Blood pressure was 135/80 when rechecked in the office.  Patient was advised to monitor blood pressure closely and to reach out to PCP if her blood pressure is elevated after the cortisone injection today.  Orders: Orders Placed This Encounter  Procedures   Large Joint Inj   No orders of the defined types were placed in this encounter.   Follow-Up Instructions: Return in about 5 months (around 07/15/2024) for Rheumatoid arthritis, Osteoporosis.   Romayne Clubs, PA-C  Note - This record has been created using Dragon software.  Chart creation errors have been sought, but may not always  have been located. Such creation errors do not reflect on  the standard of medical care.

## 2024-02-09 ENCOUNTER — Other Ambulatory Visit: Payer: Self-pay | Admitting: Rheumatology

## 2024-02-09 DIAGNOSIS — M0579 Rheumatoid arthritis with rheumatoid factor of multiple sites without organ or systems involvement: Secondary | ICD-10-CM

## 2024-02-11 NOTE — Telephone Encounter (Signed)
 Last Fill: 11/16/2023  Eye exam: 09/26/2022 WNL   Labs: 11/16/2023 Hemoglobin is low at 10.2 most likely due to recent surgery. Creatinine is mildly elevated and stable.   Next Visit: 02/13/2024  Last Visit: 11/16/2023  WG:[id number] arthritis involving multiple sites with positive rheumatoid factor   Current Dose per office note 11/16/2023: Plaquenil  200 mg 1 tablet by mouth twice daily Monday through Friday only   Attempted to contact the patient and left message to advise patient she is due to update her eye exam.   Okay to refill Plaquenil ?

## 2024-02-13 ENCOUNTER — Ambulatory Visit: Payer: Medicare Other | Attending: Physician Assistant | Admitting: Physician Assistant

## 2024-02-13 ENCOUNTER — Encounter: Payer: Self-pay | Admitting: Physician Assistant

## 2024-02-13 VITALS — BP 135/80 | HR 57 | Resp 16 | Ht 66.0 in | Wt 172.0 lb

## 2024-02-13 DIAGNOSIS — G8929 Other chronic pain: Secondary | ICD-10-CM

## 2024-02-13 DIAGNOSIS — M65341 Trigger finger, right ring finger: Secondary | ICD-10-CM

## 2024-02-13 DIAGNOSIS — S62101S Fracture of unspecified carpal bone, right wrist, sequela: Secondary | ICD-10-CM | POA: Diagnosis not present

## 2024-02-13 DIAGNOSIS — M5030 Other cervical disc degeneration, unspecified cervical region: Secondary | ICD-10-CM

## 2024-02-13 DIAGNOSIS — Z79899 Other long term (current) drug therapy: Secondary | ICD-10-CM | POA: Diagnosis not present

## 2024-02-13 DIAGNOSIS — Z96643 Presence of artificial hip joint, bilateral: Secondary | ICD-10-CM

## 2024-02-13 DIAGNOSIS — Z96653 Presence of artificial knee joint, bilateral: Secondary | ICD-10-CM

## 2024-02-13 DIAGNOSIS — M47816 Spondylosis without myelopathy or radiculopathy, lumbar region: Secondary | ICD-10-CM

## 2024-02-13 DIAGNOSIS — M810 Age-related osteoporosis without current pathological fracture: Secondary | ICD-10-CM

## 2024-02-13 DIAGNOSIS — M25511 Pain in right shoulder: Secondary | ICD-10-CM

## 2024-02-13 DIAGNOSIS — Z8659 Personal history of other mental and behavioral disorders: Secondary | ICD-10-CM

## 2024-02-13 DIAGNOSIS — F5101 Primary insomnia: Secondary | ICD-10-CM

## 2024-02-13 DIAGNOSIS — I10 Essential (primary) hypertension: Secondary | ICD-10-CM

## 2024-02-13 DIAGNOSIS — Z87448 Personal history of other diseases of urinary system: Secondary | ICD-10-CM

## 2024-02-13 DIAGNOSIS — M797 Fibromyalgia: Secondary | ICD-10-CM

## 2024-02-13 DIAGNOSIS — R5383 Other fatigue: Secondary | ICD-10-CM

## 2024-02-13 DIAGNOSIS — M0579 Rheumatoid arthritis with rheumatoid factor of multiple sites without organ or systems involvement: Secondary | ICD-10-CM | POA: Diagnosis not present

## 2024-02-13 DIAGNOSIS — M19041 Primary osteoarthritis, right hand: Secondary | ICD-10-CM

## 2024-02-13 DIAGNOSIS — M19042 Primary osteoarthritis, left hand: Secondary | ICD-10-CM

## 2024-02-13 MED ORDER — TRIAMCINOLONE ACETONIDE 40 MG/ML IJ SUSP
40.0000 mg | INTRAMUSCULAR | Status: AC | PRN
  Administered 2024-02-13: 40 mg via INTRA_ARTICULAR

## 2024-02-13 MED ORDER — LIDOCAINE HCL 1 % IJ SOLN
1.5000 mL | INTRAMUSCULAR | Status: AC | PRN
  Administered 2024-02-13: 1.5 mL

## 2024-02-13 NOTE — Patient Instructions (Signed)
 Standing Labs We placed an order today for your standing lab work.   Please have your standing labs drawn in July and every 5 months   Please have your labs drawn 2 weeks prior to your appointment so that the provider can discuss your lab results at your appointment, if possible.  Please note that you may see your imaging and lab results in MyChart before we have reviewed them. We will contact you once all results are reviewed. Please allow our office up to 72 hours to thoroughly review all of the results before contacting the office for clarification of your results.  WALK-IN LAB HOURS  Monday through Thursday from 8:00 am -12:30 pm and 1:00 pm-4:00 pm and Friday from 8:00 am-12:00 pm.  Patients with office visits requiring labs will be seen before walk-in labs.  You may encounter longer than normal wait times. Please allow additional time. Wait times may be shorter on  Monday and Thursday afternoons.  We do not book appointments for walk-in labs. We appreciate your patience and understanding with our staff.   Labs are drawn by [facility]. Please bring your co-pay at the time of your lab draw.  You may receive a bill from [facility] for your lab work.  Please note if you are on Hydroxychloroquine  and and an order has been placed for a Hydroxychloroquine  level,  you will need to have it drawn 4 hours or more after your last dose.  If you wish to have your labs drawn at another location, please call the office 24 hours in advance so we can fax the orders.  The office is located at [address], [address], [place], [place] [address]   If you have any questions regarding directions or hours of operation,  please call [phone].   As a reminder, please drink plenty of water prior to coming for your lab work. Thanks!

## 2024-02-14 ENCOUNTER — Telehealth: Payer: Self-pay

## 2024-02-14 NOTE — Telephone Encounter (Signed)
 Opened in error

## 2024-02-15 ENCOUNTER — Other Ambulatory Visit: Payer: Self-pay | Admitting: Pharmacist

## 2024-02-15 NOTE — Progress Notes (Signed)
 Patient reports pain after each Evenity  injection and does not want to continue treatment. Therapy plan at infusion center discontinued  Jemya Depierro, PharmD, MPH, BCPS, CPP Clinical Pharmacist (Rheumatology and Pulmonology)

## 2024-02-28 ENCOUNTER — Other Ambulatory Visit: Payer: Self-pay | Admitting: Physician Assistant

## 2024-02-28 DIAGNOSIS — M0579 Rheumatoid arthritis with rheumatoid factor of multiple sites without organ or systems involvement: Secondary | ICD-10-CM

## 2024-02-29 NOTE — Telephone Encounter (Signed)
 Last Fill: 02/11/2024  Eye exam: 09/26/2022 WNL   LMOM about updating Eye exam  Labs: 11/16/2023 Creat 1.02 eGFR 59 RBC 3.68 Hemoglobin 10.2 HCT 34.2 MCHC 29.8   Next Visit: 07/16/2024  Last Visit: 02/13/2024  UJ:[id number] arthritis involving multiple sites with positive rheumatoid factor   Current Dose per office note 02/13/2024: Plaquenil  200 mg 1 tablet by mouth twice daily Monday through Friday.   Okay to refill Plaquenil ?

## 2024-03-19 ENCOUNTER — Other Ambulatory Visit: Payer: Self-pay | Admitting: Physician Assistant

## 2024-03-19 DIAGNOSIS — M0579 Rheumatoid arthritis with rheumatoid factor of multiple sites without organ or systems involvement: Secondary | ICD-10-CM

## 2024-03-19 NOTE — Telephone Encounter (Signed)
 Last Fill: 02/29/2024  Eye exam: 10/18/2022 WNL   Labs: 11/16/2023  Hemoglobin is low at 10.2 most likely due to recent surgery.  Patient should take multivitamin with iron. Creatinine is mildly elevated and stable.  PTH is mildly elevated.  Vitamin D  is 30 which is normal.   Next Visit: 07/16/2024  Last Visit: 02/13/2024  DX: Rheumatoid arthritis involving multiple sites with positive rheumatoid factor   Current Dose per office note 02/13/2024: Plaquenil  200 mg 1 tablet by mouth twice daily Monday through Friday   Okay to refill Plaquenil ?   Contacted [facility] to have them fax over update eye exam.

## 2024-04-10 ENCOUNTER — Other Ambulatory Visit: Payer: Self-pay | Admitting: Physician Assistant

## 2024-04-10 DIAGNOSIS — M0579 Rheumatoid arthritis with rheumatoid factor of multiple sites without organ or systems involvement: Secondary | ICD-10-CM

## 2024-05-12 ENCOUNTER — Other Ambulatory Visit: Payer: Self-pay | Admitting: Physician Assistant

## 2024-05-12 DIAGNOSIS — M0579 Rheumatoid arthritis with rheumatoid factor of multiple sites without organ or systems involvement: Secondary | ICD-10-CM

## 2024-07-04 NOTE — Progress Notes (Deleted)
 Office Visit Note  Patient: Tammie [name]             Date of Birth: 05-04-1951           MRN: [id number]             PCP: Nicholaus Aleck DELENA DEVONNA (Inactive) Referring: Nicholaus Aleck DELENA DEVONNA Visit Date: 07/16/2024 Occupation: Data Unavailable  Subjective:  No chief complaint on file.   History of Present Illness: Tammie [name] is a 73 y.o. female ***     Activities of Daily Living:  Patient reports morning stiffness for *** {minute/hour:19697}.   Patient {ACTIONS;DENIES/REPORTS:21021675::Denies} nocturnal pain.  Difficulty dressing/grooming: {ACTIONS;DENIES/REPORTS:21021675::Denies} Difficulty climbing stairs: {ACTIONS;DENIES/REPORTS:21021675::Denies} Difficulty getting out of chair: {ACTIONS;DENIES/REPORTS:21021675::Denies} Difficulty using hands for taps, buttons, cutlery, and/or writing: {ACTIONS;DENIES/REPORTS:21021675::Denies}  No Rheumatology ROS completed.   PMFS History:  Patient Active Problem List   Diagnosis Date Noted   Age related osteoporosis 11/20/2023   DDD (degenerative disc disease), lumbar 02/08/2017   DDD (degenerative disc disease), cervical 02/08/2017   Primary osteoarthritis of both hips 02/08/2017   Bilateral primary osteoarthritis of knee 02/08/2017   History of renal insufficiency 02/08/2017   Plantar fasciitis 02/08/2017   History of bilateral total hip arthroplasty 02/08/2017   History of total knee replacement 02/08/2017   History of depression 02/08/2017   Trapezius muscle spasm 07/18/2016   Bilateral sacroiliitis 07/18/2016   Rheumatoid arthritis involving multiple sites with positive rheumatoid factor (HCC) 07/17/2016   High risk medications (not anticoagulants) long-term use 07/17/2016   Fibromyalgia 07/17/2016   Fatigue 07/17/2016   Insomnia 07/17/2016    Past Medical History:  Diagnosis Date   CRI (chronic renal insufficiency)    DDD (degenerative disc disease), cervical    DDD (degenerative disc  disease), lumbar    Depression    Fibromyalgia    OA (osteoarthritis) of knee    Osteoarthritis of hip    Plantar fasciitis, bilateral    Rheumatoid arthritis (HCC)     No family history on file. Past Surgical History:  Procedure Laterality Date   ANKLE FRACTURE SURGERY     APPENDECTOMY     CESAREAN SECTION     x2   CHOLECYSTECTOMY     ELBOW SURGERY     JOINT REPLACEMENT     BIL knee    JOINT REPLACEMENT     BIL hip   KNEE ARTHROPLASTY     NECK SURGERY     REPLACEMENT TOTAL KNEE Bilateral    SPINE SURGERY     TOTAL HIP ARTHROPLASTY Bilateral    WRIST SURGERY Right    Social History   Tobacco Use   Smoking status: Never    Passive exposure: Never   Smokeless tobacco: Never  Vaping Use   Vaping status: Never Used  Substance Use Topics   Alcohol use: No   Drug use: No   Social History   Social History Narrative   Not on file     Immunization History  Administered Date(s) Administered   Influenza,inj,Quad PF,6+ Mos 07/13/2016   Influenza-Unspecified 07/13/2016   Pneumococcal Conjugate-13 05/29/2017     Objective: Vital Signs: There were no vitals taken for this visit.   Physical Exam   Musculoskeletal Exam: ***  CDAI Exam: CDAI Score: -- Patient Global: --; Provider Global: -- Swollen: --; Tender: -- Joint Exam 07/16/2024   No joint exam has been documented for this visit   There is currently no information documented on the homunculus. Go to the Rheumatology  activity and complete the homunculus joint exam.  Investigation: No additional findings.  Imaging: No results found.  Recent Labs: Lab Results  Component Value Date   WBC 5.3 11/16/2023   HGB 10.2 (L) 11/16/2023   PLT 353 11/16/2023   NA 142 11/16/2023   K 4.5 11/16/2023   CL 109 11/16/2023   CO2 26 11/16/2023   GLUCOSE 79 11/16/2023   BUN 17 11/16/2023   CREATININE 1.02 (H) 11/16/2023   BILITOT 0.3 11/16/2023   ALKPHOS 98 02/16/2017   AST 15 11/16/2023   ALT 8 11/16/2023    PROT 6.4 11/16/2023   ALBUMIN 3.8 02/16/2017   CALCIUM 9.4 11/16/2023   GFRAA 64 09/29/2020    Speciality Comments: PLQ Eye Exam: 09/26/2022 WNL@ [facility] Follow up in 1 year Fosamax 2020-2023 by another provider History of radiation therapy per patient  Procedures:  No procedures performed Allergies: Patient has no known allergies.   Assessment / Plan:     Visit Diagnoses: No diagnosis found.  Orders: No orders of the defined types were placed in this encounter.  No orders of the defined types were placed in this encounter.   Face-to-face time spent with patient was *** minutes. Greater than 50% of time was spent in counseling and coordination of care.  Follow-Up Instructions: No follow-ups on file.   Daved JAYSON Gavel, CMA  Note - This record has been created using Animal nutritionist.  Chart creation errors have been sought, but may not always  have been located. Such creation errors do not reflect on  the standard of medical care.

## 2024-07-15 ENCOUNTER — Other Ambulatory Visit: Payer: Self-pay | Admitting: Rheumatology

## 2024-07-15 DIAGNOSIS — M0579 Rheumatoid arthritis with rheumatoid factor of multiple sites without organ or systems involvement: Secondary | ICD-10-CM

## 2024-07-15 MED ORDER — HYDROXYCHLOROQUINE SULFATE 200 MG PO TABS: ORAL_TABLET | ORAL | 0 refills | Status: AC

## 2024-07-15 NOTE — Telephone Encounter (Signed)
 Patient contacted the office to request a medication refill.   1. Name of Medication: Hydroxychloroquine   2. How are you currently taking this medication (dosage and times per day)? 200 mg / twice daily Monday - Friday   3. What pharmacy would you like for that to be sent to? [facility] at [address] in [place]  Patient underwent hip replacement surgery 5 days ago.  Will call back to reschedule her appointment when she is cleared by her surgeon.

## 2024-07-15 NOTE — Telephone Encounter (Signed)
 Last Fill: 03/19/2024  Eye exam: 09/26/2022 WNL   Labs: 07/09/2024 RBC 3.20, Hgb 9.8, Hct 29.3, Glucose 102  Next Visit: will call back to reschedule, just had hip surgery  Last Visit: 02/13/2024  IK:Myzlfjunpi arthritis involving multiple sites with positive rheumatoid factor   Current Dose per office note 02/13/2024: Plaquenil  200 mg 1 tablet by mouth twice daily Monday through Friday   Patient advised she is due to update her eye exam. They will call to have results sent to our office.   Okay to refill Plaquenil ?

## 2024-07-16 ENCOUNTER — Ambulatory Visit: Admitting: Rheumatology

## 2024-07-16 DIAGNOSIS — M5030 Other cervical disc degeneration, unspecified cervical region: Secondary | ICD-10-CM

## 2024-07-16 DIAGNOSIS — M0579 Rheumatoid arthritis with rheumatoid factor of multiple sites without organ or systems involvement: Secondary | ICD-10-CM

## 2024-07-16 DIAGNOSIS — R5383 Other fatigue: Secondary | ICD-10-CM

## 2024-07-16 DIAGNOSIS — M47816 Spondylosis without myelopathy or radiculopathy, lumbar region: Secondary | ICD-10-CM

## 2024-07-16 DIAGNOSIS — Z8659 Personal history of other mental and behavioral disorders: Secondary | ICD-10-CM

## 2024-07-16 DIAGNOSIS — M810 Age-related osteoporosis without current pathological fracture: Secondary | ICD-10-CM

## 2024-07-16 DIAGNOSIS — I10 Essential (primary) hypertension: Secondary | ICD-10-CM

## 2024-07-16 DIAGNOSIS — Z96643 Presence of artificial hip joint, bilateral: Secondary | ICD-10-CM

## 2024-07-16 DIAGNOSIS — S62101S Fracture of unspecified carpal bone, right wrist, sequela: Secondary | ICD-10-CM

## 2024-07-16 DIAGNOSIS — G8929 Other chronic pain: Secondary | ICD-10-CM

## 2024-07-16 DIAGNOSIS — Z79899 Other long term (current) drug therapy: Secondary | ICD-10-CM

## 2024-07-16 DIAGNOSIS — F5101 Primary insomnia: Secondary | ICD-10-CM

## 2024-07-16 DIAGNOSIS — M65341 Trigger finger, right ring finger: Secondary | ICD-10-CM

## 2024-07-16 DIAGNOSIS — M19041 Primary osteoarthritis, right hand: Secondary | ICD-10-CM

## 2024-07-16 DIAGNOSIS — Z96653 Presence of artificial knee joint, bilateral: Secondary | ICD-10-CM

## 2024-07-16 DIAGNOSIS — M797 Fibromyalgia: Secondary | ICD-10-CM

## 2024-07-16 DIAGNOSIS — Z87448 Personal history of other diseases of urinary system: Secondary | ICD-10-CM
